# Patient Record
Sex: Female | Born: 1980 | Race: Black or African American | Hispanic: No
Health system: Southern US, Community
[De-identification: ages and names within clinical notes are randomized; demographics above are authoritative.]

## PROBLEM LIST (undated history)

## (undated) DIAGNOSIS — K219 Gastro-esophageal reflux disease without esophagitis: Secondary | ICD-10-CM

## (undated) DIAGNOSIS — R7303 Prediabetes: Secondary | ICD-10-CM

## (undated) DIAGNOSIS — F0781 Postconcussional syndrome: Secondary | ICD-10-CM

## (undated) DIAGNOSIS — E041 Nontoxic single thyroid nodule: Secondary | ICD-10-CM

## (undated) DIAGNOSIS — E785 Hyperlipidemia, unspecified: Secondary | ICD-10-CM

## (undated) DIAGNOSIS — G43909 Migraine, unspecified, not intractable, without status migrainosus: Secondary | ICD-10-CM

## (undated) DIAGNOSIS — E559 Vitamin D deficiency, unspecified: Secondary | ICD-10-CM

## (undated) DIAGNOSIS — R200 Anesthesia of skin: Secondary | ICD-10-CM

## (undated) DIAGNOSIS — J309 Allergic rhinitis, unspecified: Secondary | ICD-10-CM

## (undated) DIAGNOSIS — R202 Paresthesia of skin: Secondary | ICD-10-CM

## (undated) DIAGNOSIS — Z973 Presence of spectacles and contact lenses: Secondary | ICD-10-CM

## (undated) HISTORY — PX: APPENDECTOMY: SHX54

## (undated) HISTORY — PX: KNEE RECONSTRUCTION: SHX5883

---
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
## 2001-02-26 ENCOUNTER — Emergency Department (HOSPITAL_COMMUNITY): Admission: EM | Admit: 2001-02-26 | Discharge: 2001-02-26 | Payer: Self-pay | Admitting: Emergency Medicine

## 2012-06-19 DIAGNOSIS — F419 Anxiety disorder, unspecified: Secondary | ICD-10-CM | POA: Insufficient documentation

## 2012-06-19 DIAGNOSIS — F410 Panic disorder [episodic paroxysmal anxiety] without agoraphobia: Secondary | ICD-10-CM | POA: Insufficient documentation

## 2019-10-28 ENCOUNTER — Other Ambulatory Visit: Payer: Self-pay

## 2019-10-28 ENCOUNTER — Encounter (HOSPITAL_COMMUNITY): Payer: Self-pay | Admitting: Emergency Medicine

## 2019-10-28 ENCOUNTER — Emergency Department (HOSPITAL_COMMUNITY): Payer: BC Managed Care – PPO

## 2019-10-28 ENCOUNTER — Emergency Department (HOSPITAL_COMMUNITY)
Admission: EM | Admit: 2019-10-28 | Discharge: 2019-10-28 | Disposition: A | Discharge status: Home / Self Care | Payer: BC Managed Care – PPO | Attending: Emergency Medicine | Admitting: Emergency Medicine

## 2019-10-28 DIAGNOSIS — W19XXXA Unspecified fall, initial encounter: Secondary | ICD-10-CM

## 2019-10-28 DIAGNOSIS — H538 Other visual disturbances: Secondary | ICD-10-CM | POA: Diagnosis not present

## 2019-10-28 DIAGNOSIS — S0502XA Injury of conjunctiva and corneal abrasion without foreign body, left eye, initial encounter: Secondary | ICD-10-CM | POA: Diagnosis not present

## 2019-10-28 DIAGNOSIS — Y9389 Activity, other specified: Secondary | ICD-10-CM | POA: Insufficient documentation

## 2019-10-28 DIAGNOSIS — R42 Dizziness and giddiness: Secondary | ICD-10-CM | POA: Diagnosis not present

## 2019-10-28 DIAGNOSIS — Y92488 Other paved roadways as the place of occurrence of the external cause: Secondary | ICD-10-CM | POA: Diagnosis not present

## 2019-10-28 DIAGNOSIS — S0993XA Unspecified injury of face, initial encounter: Secondary | ICD-10-CM | POA: Diagnosis present

## 2019-10-28 DIAGNOSIS — Y999 Unspecified external cause status: Secondary | ICD-10-CM | POA: Diagnosis not present

## 2019-10-28 DIAGNOSIS — S60512A Abrasion of left hand, initial encounter: Secondary | ICD-10-CM | POA: Insufficient documentation

## 2019-10-28 DIAGNOSIS — S0081XA Abrasion of other part of head, initial encounter: Secondary | ICD-10-CM

## 2019-10-28 DIAGNOSIS — Z79899 Other long term (current) drug therapy: Secondary | ICD-10-CM | POA: Insufficient documentation

## 2019-10-28 DIAGNOSIS — S80811A Abrasion, right lower leg, initial encounter: Secondary | ICD-10-CM | POA: Diagnosis not present

## 2019-10-28 MED ORDER — ACETAMINOPHEN 325 MG PO TABS
650.0000 mg | ORAL_TABLET | Freq: Four times a day (QID) | ORAL | Status: DC | PRN
  Administered 2019-10-28: 650 mg via ORAL
  Filled 2019-10-28: qty 2

## 2019-10-28 MED ORDER — FLUORESCEIN SODIUM 1 MG OP STRP
1.0000 | ORAL_STRIP | Freq: Once | OPHTHALMIC | Status: AC
  Administered 2019-10-28: 1 via OPHTHALMIC
  Filled 2019-10-28: qty 1

## 2019-10-28 MED ORDER — TETRACAINE HCL 0.5 % OP SOLN
2.0000 [drp] | Freq: Once | OPHTHALMIC | Status: AC
  Administered 2019-10-28: 2 [drp] via OPHTHALMIC
  Filled 2019-10-28: qty 4

## 2019-10-28 MED ORDER — ERYTHROMYCIN 5 MG/GM OP OINT: TOPICAL_OINTMENT | OPHTHALMIC | 0 refills | Status: DC

## 2019-10-28 MED ORDER — ERYTHROMYCIN 5 MG/GM OP OINT
1.0000 "application " | TOPICAL_OINTMENT | Freq: Once | OPHTHALMIC | Status: DC
  Filled 2019-10-28: qty 3.5
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
## 2019-10-28 NOTE — Discharge Instructions (Addendum)
Please apply Neosporin or bacitracin twice daily for the next few days answer abrasions on your face, hand, leg heel.  Please follow-up with your PCP.  Please use Tylenol or Motrin for any further pain.  Please make sure to put the erythromycin ointment on your left eye to help heal the corneal abrasion. You have a small thyroid nodule that needs to be evaluated with a ultrasound by your PCP.

## 2019-10-28 NOTE — ED Notes (Signed)
Per RN Myland I Stat Beta and chem 8 cancelled no longer needed

## 2019-10-28 NOTE — ED Notes (Signed)
Pt verbalized understanding of discharge instructions and recommended follow-up. Time was given for questions and teach back.

## 2019-10-31 ENCOUNTER — Other Ambulatory Visit: Payer: Self-pay | Admitting: Obstetrics & Gynecology

## 2019-11-05 ENCOUNTER — Other Ambulatory Visit: Payer: Self-pay | Admitting: Obstetrics & Gynecology

## 2019-11-05 ENCOUNTER — Ambulatory Visit
Admission: RE | Admit: 2019-11-05 | Discharge: 2019-11-05 | Disposition: A | Discharge status: Home / Self Care | Payer: BC Managed Care – PPO | Source: Ambulatory Visit | Attending: Obstetrics & Gynecology | Admitting: Obstetrics & Gynecology

## 2019-11-05 DIAGNOSIS — E079 Disorder of thyroid, unspecified: Secondary | ICD-10-CM

## 2019-11-09 ENCOUNTER — Other Ambulatory Visit: Payer: Self-pay | Admitting: Internal Medicine

## 2019-11-09 DIAGNOSIS — E049 Nontoxic goiter, unspecified: Secondary | ICD-10-CM

## 2019-11-09 DIAGNOSIS — E041 Nontoxic single thyroid nodule: Secondary | ICD-10-CM

## 2019-12-04 ENCOUNTER — Other Ambulatory Visit (HOSPITAL_COMMUNITY)
Admission: RE | Admit: 2019-12-04 | Discharge: 2019-12-04 | Disposition: A | Discharge status: Home / Self Care | Payer: BC Managed Care – PPO | Source: Ambulatory Visit | Attending: Internal Medicine | Admitting: Internal Medicine

## 2019-12-04 ENCOUNTER — Ambulatory Visit
Admission: RE | Admit: 2019-12-04 | Discharge: 2019-12-04 | Disposition: A | Discharge status: Home / Self Care | Payer: BC Managed Care – PPO | Source: Ambulatory Visit | Attending: Internal Medicine | Admitting: Internal Medicine

## 2019-12-04 DIAGNOSIS — E049 Nontoxic goiter, unspecified: Secondary | ICD-10-CM | POA: Diagnosis not present

## 2019-12-04 DIAGNOSIS — E041 Nontoxic single thyroid nodule: Secondary | ICD-10-CM | POA: Diagnosis present

## 2019-12-07 LAB — CYTOLOGY - NON PAP

## 2020-11-07 ENCOUNTER — Other Ambulatory Visit: Payer: Self-pay | Admitting: Internal Medicine

## 2020-11-07 DIAGNOSIS — E041 Nontoxic single thyroid nodule: Secondary | ICD-10-CM

## 2020-11-07 DIAGNOSIS — E049 Nontoxic goiter, unspecified: Secondary | ICD-10-CM

## 2020-11-14 ENCOUNTER — Ambulatory Visit
Admission: RE | Admit: 2020-11-14 | Discharge: 2020-11-14 | Disposition: A | Discharge status: Home / Self Care | Payer: BC Managed Care – PPO | Source: Ambulatory Visit | Attending: Internal Medicine | Admitting: Internal Medicine

## 2020-11-14 DIAGNOSIS — E049 Nontoxic goiter, unspecified: Secondary | ICD-10-CM

## 2020-11-14 DIAGNOSIS — E041 Nontoxic single thyroid nodule: Secondary | ICD-10-CM

## 2021-03-18 ENCOUNTER — Ambulatory Visit
Admission: RE | Admit: 2021-03-18 | Discharge: 2021-03-18 | Disposition: A | Discharge status: Home / Self Care | Payer: BC Managed Care – PPO | Source: Ambulatory Visit | Attending: Emergency Medicine | Admitting: Emergency Medicine

## 2021-03-18 ENCOUNTER — Other Ambulatory Visit: Payer: Self-pay

## 2021-03-18 ENCOUNTER — Telehealth: Payer: Self-pay

## 2021-03-18 VITALS — BP 138/83 | HR 79 | Temp 98.1°F | Resp 18

## 2021-03-18 DIAGNOSIS — S161XXA Strain of muscle, fascia and tendon at neck level, initial encounter: Secondary | ICD-10-CM | POA: Diagnosis not present

## 2021-03-18 DIAGNOSIS — M546 Pain in thoracic spine: Secondary | ICD-10-CM

## 2021-03-18 MED ORDER — TIZANIDINE HCL 2 MG PO TABS: 2.0000 mg | ORAL_TABLET | Freq: Four times a day (QID) | ORAL | 0 refills | Status: DC | PRN

## 2021-03-18 MED ORDER — HYDROXYZINE HCL 10 MG/5ML PO SYRP: 25.0000 mg | ORAL_SOLUTION | Freq: Three times a day (TID) | ORAL | 0 refills | Status: DC | PRN

## 2021-03-18 MED ORDER — IBUPROFEN 100 MG/5ML PO SUSP: 600.0000 mg | Freq: Three times a day (TID) | ORAL | 0 refills | Status: DC

## 2021-03-18 MED ORDER — NAPROXEN 125 MG/5ML PO SUSP: 500.0000 mg | Freq: Two times a day (BID) | ORAL | 0 refills | Status: DC
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
## 2021-03-18 NOTE — Discharge Instructions (Signed)
Continue Tylenol/ibuprofen or may use naproxen as alternative for headache, neck pain and back pain Supplement tizanidine which is a muscle relaxer as needed at home/bedtime, may cause drowsiness May try hydroxyzine as needed for anxiety-this may also cause drowsiness, limit use with muscle relaxer, avoid driving after use Alternate ice and heat Follow-up if not improving or worsening
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
## 2021-03-22 ENCOUNTER — Emergency Department (HOSPITAL_COMMUNITY): Payer: BC Managed Care – PPO

## 2021-03-22 ENCOUNTER — Emergency Department (HOSPITAL_COMMUNITY)
Admission: EM | Admit: 2021-03-22 | Discharge: 2021-03-22 | Disposition: A | Discharge status: Home / Self Care | Payer: BC Managed Care – PPO | Attending: Emergency Medicine | Admitting: Emergency Medicine

## 2021-03-22 ENCOUNTER — Other Ambulatory Visit: Payer: Self-pay

## 2021-03-22 ENCOUNTER — Ambulatory Visit
Admission: RE | Admit: 2021-03-22 | Discharge: 2021-03-22 | Disposition: A | Discharge status: Home / Self Care | Payer: BC Managed Care – PPO | Source: Ambulatory Visit | Attending: Dermatology | Admitting: Dermatology

## 2021-03-22 DIAGNOSIS — S060X0A Concussion without loss of consciousness, initial encounter: Secondary | ICD-10-CM | POA: Diagnosis not present

## 2021-03-22 DIAGNOSIS — H53143 Visual discomfort, bilateral: Secondary | ICD-10-CM | POA: Diagnosis not present

## 2021-03-22 DIAGNOSIS — M545 Low back pain, unspecified: Secondary | ICD-10-CM | POA: Diagnosis not present

## 2021-03-22 DIAGNOSIS — S0990XA Unspecified injury of head, initial encounter: Secondary | ICD-10-CM | POA: Diagnosis present

## 2021-03-22 DIAGNOSIS — Y92410 Unspecified street and highway as the place of occurrence of the external cause: Secondary | ICD-10-CM | POA: Insufficient documentation

## 2021-03-22 MED ORDER — DIAZEPAM 5 MG PO TABS
5.0000 mg | ORAL_TABLET | Freq: Once | ORAL | Status: AC
  Administered 2021-03-22: 5 mg via ORAL
  Filled 2021-03-22: qty 1
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
## 2021-03-23 ENCOUNTER — Telehealth: Payer: Self-pay | Admitting: Family Medicine

## 2021-03-23 NOTE — Telephone Encounter (Signed)
Patient called stating that she was seen in the ED yesterday for a concussion and was told to follow up with [place].She has had a continued headache, brain fog, trouble finding her words, sensitivity to light and back pain.  Please advise.

## 2021-03-24 NOTE — Telephone Encounter (Signed)
Called pt and scheduled her for initial concussion visit on 03/26/21 at 3:30 pm.
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
## 2021-03-26 ENCOUNTER — Other Ambulatory Visit: Payer: Self-pay

## 2021-03-26 ENCOUNTER — Ambulatory Visit: Payer: BC Managed Care – PPO | Admitting: Family Medicine

## 2021-03-26 ENCOUNTER — Encounter: Payer: Self-pay | Admitting: Family Medicine

## 2021-03-26 VITALS — BP 102/72 | HR 89 | Ht 64.0 in | Wt 186.4 lb

## 2021-03-26 DIAGNOSIS — G43909 Migraine, unspecified, not intractable, without status migrainosus: Secondary | ICD-10-CM | POA: Insufficient documentation

## 2021-03-26 DIAGNOSIS — R42 Dizziness and giddiness: Secondary | ICD-10-CM

## 2021-03-26 DIAGNOSIS — E049 Nontoxic goiter, unspecified: Secondary | ICD-10-CM | POA: Insufficient documentation

## 2021-03-26 DIAGNOSIS — S060X0A Concussion without loss of consciousness, initial encounter: Secondary | ICD-10-CM

## 2021-03-26 DIAGNOSIS — G4700 Insomnia, unspecified: Secondary | ICD-10-CM | POA: Insufficient documentation

## 2021-03-26 MED ORDER — NORTRIPTYLINE HCL 25 MG PO CAPS: 25.0000 mg | ORAL_CAPSULE | Freq: Every day | ORAL | 2 refills | Status: DC

## 2021-03-26 NOTE — Patient Instructions (Addendum)
Thank you for coming in today.  I've referred you to Physical Therapy.  Let us know if you don't hear from them in one week.  Recheck in 1-2 weeks.   Try stopping the hydroxyzine.   Try reducing the tizanidine muscle relaxer. Both are sedating and may be slowing you down.

## 2021-04-07 ENCOUNTER — Other Ambulatory Visit: Payer: Self-pay

## 2021-04-07 ENCOUNTER — Ambulatory Visit: Payer: BC Managed Care – PPO | Admitting: Family Medicine

## 2021-04-07 VITALS — BP 102/71 | HR 79 | Ht 64.0 in | Wt 183.0 lb

## 2021-04-07 DIAGNOSIS — S060X0D Concussion without loss of consciousness, subsequent encounter: Secondary | ICD-10-CM | POA: Diagnosis not present

## 2021-04-07 DIAGNOSIS — G4700 Insomnia, unspecified: Secondary | ICD-10-CM

## 2021-04-07 DIAGNOSIS — R42 Dizziness and giddiness: Secondary | ICD-10-CM

## 2021-04-07 MED ORDER — HYDROXYZINE HCL 10 MG/5ML PO SYRP: 25.0000 mg | ORAL_SOLUTION | Freq: Three times a day (TID) | ORAL | 2 refills | Status: DC | PRN
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
## 2021-04-07 NOTE — Patient Instructions (Addendum)
Thank you for coming in today.  Recheck in 2 weeks.   Restart the hydroxyzine instead of the nortrypltine at bedtime if it works better.  Let me know if you need a refill.   Keep me updated.    I am so happy you are improving.

## 2021-04-16 ENCOUNTER — Other Ambulatory Visit: Payer: Self-pay

## 2021-04-16 ENCOUNTER — Ambulatory Visit: Payer: BC Managed Care – PPO | Attending: Family Medicine | Admitting: Physical Therapy

## 2021-04-16 ENCOUNTER — Encounter: Payer: Self-pay | Admitting: Physical Therapy

## 2021-04-16 DIAGNOSIS — R2689 Other abnormalities of gait and mobility: Secondary | ICD-10-CM | POA: Insufficient documentation

## 2021-04-16 DIAGNOSIS — R42 Dizziness and giddiness: Secondary | ICD-10-CM | POA: Insufficient documentation

## 2021-04-16 DIAGNOSIS — R2681 Unsteadiness on feet: Secondary | ICD-10-CM | POA: Insufficient documentation

## 2021-04-16 NOTE — Patient Instructions (Signed)
Bending / Picking Up Objects - PROGRESS to standing    Sitting, slowly bend head down and pick up object on the floor. Return to upright position. Hold position until symptoms subside. Repeat __5__ times per session. Do _2___ sessions per day.     Gaze Stabilization: Tip Card  1.Target must remain in focus, not blurry, and appear stationary while head is in motion. 2.Perform exercises with small head movements (45 to either side of midline). 3.Increase speed of head motion so long as target is in focus. 4.If you wear eyeglasses, be sure you can see target through lens (therapist will give specific instructions for bifocal / progressive lenses). 5.These exercises may provoke dizziness or nausea. Work through these symptoms. If too dizzy, slow head movement slightly. Rest between each exercise. 6.Exercises demand concentration; avoid distractions. 7.For safety, perform standing exercises close to a counter, wall, corner, or next to someone.    Gaze Stabilization: Standing Feet Apart - plain background    Feet shoulder width apart, keeping eyes on target on wall __6__ feet away, tilt head down 15-30 and move head side to side for _60___ seconds. Repeat while moving head up and down for _60__ seconds. Do _3-5___ sessions per day.
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
## 2021-04-21 ENCOUNTER — Other Ambulatory Visit: Payer: Self-pay

## 2021-04-21 ENCOUNTER — Encounter: Payer: Self-pay | Admitting: Family Medicine

## 2021-04-21 ENCOUNTER — Ambulatory Visit: Payer: BC Managed Care – PPO | Admitting: Family Medicine

## 2021-04-21 VITALS — BP 106/72 | HR 84 | Ht 64.0 in | Wt 188.4 lb

## 2021-04-21 DIAGNOSIS — S060X0D Concussion without loss of consciousness, subsequent encounter: Secondary | ICD-10-CM

## 2021-04-21 DIAGNOSIS — F4310 Post-traumatic stress disorder, unspecified: Secondary | ICD-10-CM | POA: Diagnosis not present

## 2021-04-21 DIAGNOSIS — R413 Other amnesia: Secondary | ICD-10-CM | POA: Diagnosis not present

## 2021-04-21 HISTORY — DX: Post-traumatic stress disorder, unspecified: F43.10
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
## 2021-04-21 NOTE — Patient Instructions (Signed)
Thank you for coming in today.  I have added speech therapy to help with memory for you.   Continue balance therapy.   Continue medicine.   Consider EMDR (Eye Movement Desensitization and Reprocessing) therapy.  You can good therapists in your area that may offer it and see if you can get an appointment.   Recheck with me in 1 month.

## 2021-04-24 ENCOUNTER — Ambulatory Visit: Payer: BC Managed Care – PPO

## 2021-04-24 ENCOUNTER — Other Ambulatory Visit: Payer: Self-pay

## 2021-04-24 DIAGNOSIS — R42 Dizziness and giddiness: Secondary | ICD-10-CM

## 2021-04-24 DIAGNOSIS — R2681 Unsteadiness on feet: Secondary | ICD-10-CM

## 2021-04-24 DIAGNOSIS — R2689 Other abnormalities of gait and mobility: Secondary | ICD-10-CM
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
## 2021-05-08 ENCOUNTER — Ambulatory Visit: Payer: BC Managed Care – PPO

## 2021-05-08 ENCOUNTER — Other Ambulatory Visit: Payer: Self-pay

## 2021-05-08 ENCOUNTER — Ambulatory Visit: Payer: BC Managed Care – PPO | Attending: Family Medicine

## 2021-05-08 DIAGNOSIS — R41841 Cognitive communication deficit: Secondary | ICD-10-CM | POA: Insufficient documentation

## 2021-05-08 DIAGNOSIS — R2689 Other abnormalities of gait and mobility: Secondary | ICD-10-CM | POA: Diagnosis present

## 2021-05-08 DIAGNOSIS — R2681 Unsteadiness on feet: Secondary | ICD-10-CM | POA: Insufficient documentation

## 2021-05-08 DIAGNOSIS — R4701 Aphasia: Secondary | ICD-10-CM | POA: Diagnosis present

## 2021-05-08 DIAGNOSIS — R42 Dizziness and giddiness: Secondary | ICD-10-CM | POA: Diagnosis present
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
## 2021-05-08 NOTE — Patient Instructions (Signed)
Access Code: [email or web address] URL: [email or web address] Date: 05/08/2021 Prepared by: Baldomero Lamy  Exercises Standing Balance with Eyes Closed on Foam - 1 x daily - 5 x weekly - 1 sets - 3 reps - 20-30 seconds hold Romberg Stance on Foam Pad with Head Rotation - 1 x daily - 5 x weekly - 2 sets - 10 reps Romberg Stance with Head Nods on Foam Pad - 1 x daily - 5 x weekly - 2 sets - 10 reps Tandem Stance - 1 x daily - 5 x weekly - 1 sets - 3 reps - 15-20 seconds hold

## 2021-05-08 NOTE — Patient Instructions (Signed)
When you have trouble saying the word you want to say:  1)  Describe it! Describe the size, color, shape, function, composition (what it's made of), and/or location to be able to have the word come sooner, or to have your listener help you out  2) "talk around the word" (say it a totally different way) -get your point out using different words than the one/ones you can't think of  3) Use a synonym - think of another word that means the exact same thing  4) DRAW! You can draw some things you want to say in order to give your listener a hint about what you're talking about  5)  Gesture- make motions to help your listener understand what you are trying to communicate  6) Write down the word, if you can - or the first letter or letters, to help you say the word or to give your listener a hint about what you're trying to say     Memory Compensation Strategies  1. Use "WARM" strategy. W= write it down A=  associate it R=  repeat it M=  make a mental picture  2. You can keep a Memory Notebook. Use a 3-ring notebook with sections for the following:  calendar, important names and phone numbers, medications, doctors' names/phone numbers, "to do list"/reminders, and a section to journal what you did each day  3. Use a calendar to write appointments down.  4. Write yourself a schedule for the day.  This can be placed on the calendar or in a separate section of the Memory Notebook.  Keeping a regular schedule can help memory.  5. Use medication organizer with sections for each day or morning/evening pills  You may need help loading it  6. Keep a basket, or pegboard by the door.   Place items that you need to take out with you in the basket or on the pegboard.  You may also want to include a message board for reminders.  7. Use sticky notes. Place sticky notes with reminders in a place where the task is performed.  For example:  "turn off the stove" placed by the stove, "lock the door"  placed on the door at eye level, "take your medications" on the bathroom mirror or by the place where you normally take your medications  8. Use alarms/timers.  Use while cooking to remind yourself to check on food or as a reminder to take your medicine, or as a reminder to make a call, or as a reminder to perform another task, etc.  9. Use a voice recorder app or small tape recorder to record important information and notes for yourself. Go back at the end of the day and listen to these.  
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
## 2021-05-11 ENCOUNTER — Other Ambulatory Visit: Payer: Self-pay

## 2021-05-11 ENCOUNTER — Ambulatory Visit: Payer: BC Managed Care – PPO

## 2021-05-11 DIAGNOSIS — R4701 Aphasia: Secondary | ICD-10-CM

## 2021-05-11 DIAGNOSIS — R42 Dizziness and giddiness: Secondary | ICD-10-CM | POA: Diagnosis not present

## 2021-05-11 DIAGNOSIS — R41841 Cognitive communication deficit: Secondary | ICD-10-CM
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
## 2021-05-11 NOTE — Patient Instructions (Signed)
  Constant Therapy- 14 day free trial  -

## 2021-05-15 ENCOUNTER — Ambulatory Visit: Payer: BC Managed Care – PPO

## 2021-05-15 ENCOUNTER — Other Ambulatory Visit: Payer: Self-pay

## 2021-05-15 DIAGNOSIS — R42 Dizziness and giddiness: Secondary | ICD-10-CM | POA: Diagnosis not present

## 2021-05-15 DIAGNOSIS — R4701 Aphasia: Secondary | ICD-10-CM

## 2021-05-15 DIAGNOSIS — R41841 Cognitive communication deficit: Secondary | ICD-10-CM
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
## 2021-05-18 ENCOUNTER — Other Ambulatory Visit: Payer: Self-pay

## 2021-05-18 ENCOUNTER — Ambulatory Visit: Payer: BC Managed Care – PPO

## 2021-05-18 DIAGNOSIS — R42 Dizziness and giddiness: Secondary | ICD-10-CM | POA: Diagnosis not present

## 2021-05-18 DIAGNOSIS — R4701 Aphasia: Secondary | ICD-10-CM

## 2021-05-18 DIAGNOSIS — R41841 Cognitive communication deficit: Secondary | ICD-10-CM
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
## 2021-05-20 ENCOUNTER — Ambulatory Visit: Payer: BC Managed Care – PPO | Admitting: Family Medicine

## 2021-05-20 ENCOUNTER — Other Ambulatory Visit: Payer: Self-pay

## 2021-05-20 VITALS — BP 108/76 | HR 86 | Ht 64.0 in | Wt 187.0 lb

## 2021-05-20 DIAGNOSIS — S060X0D Concussion without loss of consciousness, subsequent encounter: Secondary | ICD-10-CM | POA: Diagnosis not present

## 2021-05-20 DIAGNOSIS — F0781 Postconcussional syndrome: Secondary | ICD-10-CM | POA: Diagnosis not present

## 2021-05-20 MED ORDER — AMPHETAMINE-DEXTROAMPHET ER 10 MG PO CP24: 10.0000 mg | ORAL_CAPSULE | Freq: Every day | ORAL | 0 refills | Status: DC

## 2021-05-20 NOTE — Patient Instructions (Signed)
Thank you for coming in today.  Lets start the adderall for concentration and memory.   Continue speech therapy.   Recheck with me in about 1 month.   Let me know if this is not working. I have some other ideas.   Amphetamine; Dextroamphetamine extended-release capsules What is this medicine? AMPHETAMINE; DEXTROAMPHETAMINE (am FET a meen; dex troe am FET a meen) is used to treat attention-deficit hyperactivity disorder (ADHD). Federal law prohibits giving this medicine to any person other than the person for whom it was prescribed. Do not share this medicine with anyone else. This medicine may be used for other purposes; ask your health care provider or pharmacist if you have questions. COMMON BRAND NAME(S): Adderall XR, Mydayis What should I tell my health care provider before I take this medicine? They need to know if you have any of these conditions:  anxiety or panic attacks  circulation problems in fingers and toes  glaucoma  hardening or blockages of the arteries or heart blood vessels  heart disease or a heart defect  high blood pressure  history of a drug or alcohol abuse problem  history of stroke  kidney disease  liver disease  mental illness  seizures  suicidal thoughts, plans, or attempt; a previous suicide attempt by you or a family member  thyroid disease  Tourette's syndrome  an unusual or allergic reaction to dextroamphetamine, other amphetamines, other medicines, foods, dyes, or preservatives  pregnant or trying to get pregnant  breast-feeding How should I use this medicine? Take this medicine by mouth with a glass of water. Follow the directions on the prescription label. This medicine is taken just one time per day, usually in the morning after waking up. Take with or without food. Do not chew or crush this medicine. You may open the capsules and sprinkle the medicine on a spoonful of applesauce. If sprinkled on applesauce, take the dose  immediately and do not crush or chew. Do not take your medicine more often than directed. A special MedGuide will be given to you by the pharmacist with each prescription and refill. Be sure to read this information carefully each time. Talk to your pediatrician regarding the use of this medicine in children. While this drug may be prescribed for children as young as 6 years for selected conditions, precautions do apply. Some extended-release capsules are recommended for use only in children 26 years of age and older. Overdosage: If you think you have taken too much of this medicine contact a poison control center or emergency room at once. NOTE: This medicine is only for you. Do not share this medicine with others. What if I miss a dose? If you miss a dose, take it as soon as you can in the morning, but do not take it later in the day because it can cause trouble sleeping. If it is almost time for your next dose, take only that dose. Do not take double or extra doses. What may interact with this medicine? Do not take this medicine with any of the following medications:  MAOIs like Carbex, Eldepryl, Marplan, Nardil, and Parnate  other stimulant medicines for attention disorders This medicine may also interact with the following medications:  acetazolamide  alcohol  ammonium chloride  antacids  ascorbic acid  atomoxetine  caffeine  certain medicines for blood pressure  certain medicines for depression, anxiety, or psychotic disturbances  certain medicines for seizures like carbamazepine, phenobarbital, phenytoin  certain medicines for stomach problems like cimetidine, ranitidine,  famotidine, esomeprazole, omeprazole, lansoprazole, pantoprazole  lithium  medicines for colds and breathing difficulties  medicines for diabetes  medicines or dietary supplements for weight loss or to stay awake  methenamine  narcotic medicines for pain  quinidine  ritonavir  sodium  bicarbonate  St. John's wort This list may not describe all possible interactions. Give your health care provider a list of all the medicines, herbs, non-prescription drugs, or dietary supplements you use. Also tell them if you smoke, drink alcohol, or use illegal drugs. Some items may interact with your medicine. What should I watch for while using this medicine? Visit your doctor or health care professional for regular checks on your progress. This prescription requires that you follow special procedures with your doctor and pharmacy. You will need to have a new written prescription from your doctor every time you need a refill. This medicine may affect your concentration, or hide signs of tiredness. Until you know how this medicine affects you, do not drive, ride a bicycle, use machinery, or do anything that needs mental alertness. Alcohol should be avoided with some brands of this medicine. Talk to your doctor or health care professional if you have questions. Tell your doctor or health care professional if this medicine loses its effects, or if you feel you need to take more than the prescribed amount. Do not change the dosage without talking to your doctor or health care professional. Decreased appetite is a common side effect when starting this medicine. Eating small, frequent meals or snacks can help. Talk to your doctor if you continue to have poor eating habits. Height and weight growth of a child taking this medicine will be monitored closely. Do not take this medicine close to bedtime. It may prevent you from sleeping. If you are going to need surgery, an MRI, a CT scan, or other procedure, tell your doctor that you are taking this medicine. You may need to stop taking this medicine before the procedure. Tell your doctor or healthcare professional right away if you notice unexplained wounds on your fingers and toes while taking this medicine. You should also tell your healthcare provider if  you experience numbness or pain, changes in the skin color, or sensitivity to temperature in your fingers or toes. What side effects may I notice from receiving this medicine? Side effects that you should report to your doctor or health care professional as soon as possible:  allergic reactions like skin rash, itching or hives, swelling of the face, lips, or tongue  anxious  breathing problems  changes in emotions or moods  changes in vision  chest pain or chest tightness  fast, irregular heartbeat  fingers or toes feel numb, cool, painful  hallucination, loss of contact with reality  high blood pressure  males: prolonged or painful erection  seizures  signs and symptoms of serotonin syndrome like confusion, increased sweating, fever, tremor, stiff muscles, diarrhea  signs and symptoms of a stroke like changes in vision; confusion; trouble speaking or understanding; severe headaches; sudden numbness or weakness of the face, arm or leg; trouble walking; dizziness; loss of balance or coordination  suicidal thoughts or other mood changes  uncontrollable head, mouth, neck, arm, or leg movements Side effects that usually do not require medical attention (report to your doctor or health care professional if they continue or are bothersome):  dry mouth  headache  irritability  loss of appetite  nausea  trouble sleeping  weight loss This list may not describe all possible  side effects. Call your doctor for medical advice about side effects. You may report side effects to FDA at 1-800-FDA-1088. Where should I keep my medicine? Keep out of the reach of children. This medicine can be abused. Keep your medicine in a safe place to protect it from theft. Do not share this medicine with anyone. Selling or giving away this medicine is dangerous and against the law. Store at room temperature between 15 and 30 degrees C (59 and 86 degrees F). Keep container tightly closed. Protect  from light. Throw away any unused medicine after the expiration date. NOTE: This sheet is a summary. It may not cover all possible information. If you have questions about this medicine, talk to your doctor, pharmacist, or health care provider.  2021 Elsevier/Gold Standard (2017-02-06 13:37:27)

## 2021-05-21 ENCOUNTER — Other Ambulatory Visit: Payer: Self-pay

## 2021-05-21 ENCOUNTER — Ambulatory Visit: Payer: BC Managed Care – PPO

## 2021-05-21 DIAGNOSIS — R41841 Cognitive communication deficit: Secondary | ICD-10-CM

## 2021-05-21 DIAGNOSIS — R4701 Aphasia: Secondary | ICD-10-CM

## 2021-05-21 DIAGNOSIS — R42 Dizziness and giddiness: Secondary | ICD-10-CM | POA: Diagnosis not present
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
## 2021-05-21 NOTE — Patient Instructions (Signed)
  Try listening to [facility] for ~10-15 minutes. See if you can multi-task for listening and writing down information.  Things to be aware of:  Do I need to pause to write down information? Do I need to rewind because I didn't understand or remember? Did I understand all that was said?  Remember to bring your HWK from last time (sitting on your desk at work)

## 2021-05-22 ENCOUNTER — Ambulatory Visit: Payer: BC Managed Care – PPO

## 2021-05-22 DIAGNOSIS — R2689 Other abnormalities of gait and mobility: Secondary | ICD-10-CM

## 2021-05-22 DIAGNOSIS — R42 Dizziness and giddiness: Secondary | ICD-10-CM | POA: Diagnosis not present

## 2021-05-22 DIAGNOSIS — R2681 Unsteadiness on feet: Secondary | ICD-10-CM
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
## 2021-05-27 ENCOUNTER — Ambulatory Visit: Payer: BC Managed Care – PPO

## 2021-05-27 ENCOUNTER — Encounter: Payer: Self-pay | Admitting: Physical Therapy

## 2021-05-27 ENCOUNTER — Ambulatory Visit: Payer: BC Managed Care – PPO | Attending: Family Medicine | Admitting: Physical Therapy

## 2021-05-27 ENCOUNTER — Other Ambulatory Visit: Payer: Self-pay

## 2021-05-27 DIAGNOSIS — R41841 Cognitive communication deficit: Secondary | ICD-10-CM | POA: Diagnosis present

## 2021-05-27 DIAGNOSIS — R4701 Aphasia: Secondary | ICD-10-CM | POA: Insufficient documentation

## 2021-05-27 DIAGNOSIS — R2689 Other abnormalities of gait and mobility: Secondary | ICD-10-CM | POA: Insufficient documentation

## 2021-05-27 DIAGNOSIS — R2681 Unsteadiness on feet: Secondary | ICD-10-CM | POA: Insufficient documentation

## 2021-05-27 DIAGNOSIS — R42 Dizziness and giddiness: Secondary | ICD-10-CM | POA: Diagnosis present
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
## 2021-05-27 NOTE — Patient Instructions (Addendum)
Strategies for Improving Your Attention and Memory  Use good eye-contact  Give the speaker your undivided attention  Look directly at the speaker  Complete one task at a time  Avoid multitasking  Complete one task before starting a new one  Write a note to yourself if you think of something else that needs to be done  Let others know when you need quiet time and can't be interrupted  Don't answer the phone, texts, or emails while you are working on another task  Put aside distracting thoughts  If you find your mind wandering, refocus your attention on the speaker  Avoid off-topic comments or responses that may divert your attention   If something important comes to mind, let the speaker know and pause to write yourself a note: "Do you mind holding on a minute, I have to write something down."  Put thoughts on hold and focus on salient information  Limit distractions in your environment  Think about the environment around you  Limit background noise by turning off the TV or music, putting your phone away  Close the door and work in quiet  Use active listening  Actively participate in the conversation to stay focused  Paraphrase what you have heard to include the most important details  Adding some associations may help you remember  Ask questions to clarify certain points  Summarize the speaker's comments periodically  Avoid nodding your head and using "mhm" responses as these are more passive and don't help your attention  Alert the other person/people  It may be helpful to alert your listener to the fact that you may need reminders to keep on track  Tell the speaker in advance that you may need to stop them and have them repeat salient information  If you lose focus, interject and let the person know, "I'm sorry, I lost you, can you tell me again?"  Write down information  Write down pertinent information as it comes up, such as telephone numbers, names  of people, addresses, details from appointments and conversations, etc.    Tips to help facilitate better attention, concentration, focus  Do harder, longer tasks when you are most alert/awake  Break down larger tasks into small parts  Limit distractions of TV, radio, conversation, e mails/texts, appliance noise, etc - if a job is important, do it in a quiet room  Be aware of how you are functioning in high stimulation environments such as large stores, parties, restaurants - any place with lots of lights, noise, signs etc  Group conversations may be more difficult to process than one on one conversations  Give yourself extra time to process conversation, reading materials, directions or information from your healthcare providers  Organization is key - clutters of laundry, mail, paperwork, dirty dishes - all make it more difficult to concentrate  Before you start a task, have all the needed supplies, directions, recipes ready and organized. This way you don't have to go looking for something in the middle of a task and become distracted.   Be aware of fatigue - take rests or breaks when needed to re-group and re-focus

## 2021-05-29 ENCOUNTER — Ambulatory Visit: Payer: BC Managed Care – PPO | Admitting: Physical Therapy
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
## 2021-06-02 ENCOUNTER — Ambulatory Visit: Payer: BC Managed Care – PPO | Admitting: Physical Therapy

## 2021-06-02 ENCOUNTER — Other Ambulatory Visit: Payer: Self-pay

## 2021-06-02 ENCOUNTER — Ambulatory Visit: Payer: BC Managed Care – PPO

## 2021-06-02 DIAGNOSIS — R41841 Cognitive communication deficit: Secondary | ICD-10-CM

## 2021-06-02 DIAGNOSIS — R42 Dizziness and giddiness: Secondary | ICD-10-CM | POA: Diagnosis not present

## 2021-06-02 DIAGNOSIS — R4701 Aphasia: Secondary | ICD-10-CM
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
## 2021-06-03 ENCOUNTER — Ambulatory Visit: Payer: BC Managed Care – PPO

## 2021-06-03 DIAGNOSIS — R2681 Unsteadiness on feet: Secondary | ICD-10-CM

## 2021-06-03 DIAGNOSIS — R41841 Cognitive communication deficit: Secondary | ICD-10-CM

## 2021-06-03 DIAGNOSIS — R4701 Aphasia: Secondary | ICD-10-CM

## 2021-06-03 DIAGNOSIS — R42 Dizziness and giddiness: Secondary | ICD-10-CM

## 2021-06-03 DIAGNOSIS — R2689 Other abnormalities of gait and mobility: Secondary | ICD-10-CM
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
## 2021-06-03 NOTE — Patient Instructions (Addendum)
  Consider internal/external distractions when you're reading, watching television, etc. See if you can block out distracting thoughts to focus on what you are doing and/or reduce distractions within your environment (close door, turn down noise, etc) to aid focus.  Keep up the great work!
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
## 2021-06-05 ENCOUNTER — Encounter: Payer: Self-pay | Admitting: Family Medicine

## 2021-06-09 ENCOUNTER — Ambulatory Visit: Payer: BC Managed Care – PPO

## 2021-06-09 NOTE — Telephone Encounter (Signed)
I assume you mean the Adderall XR? My advice is to use goodRx and bypass your insurance and buy it yourself.  It is not very expensive.

## 2021-06-12 ENCOUNTER — Ambulatory Visit: Payer: BC Managed Care – PPO | Admitting: Physical Therapy

## 2021-06-15 ENCOUNTER — Ambulatory Visit: Payer: BC Managed Care – PPO

## 2021-06-15 ENCOUNTER — Other Ambulatory Visit: Payer: Self-pay

## 2021-06-15 DIAGNOSIS — R42 Dizziness and giddiness: Secondary | ICD-10-CM

## 2021-06-15 DIAGNOSIS — R4701 Aphasia: Secondary | ICD-10-CM

## 2021-06-15 DIAGNOSIS — R41841 Cognitive communication deficit: Secondary | ICD-10-CM

## 2021-06-15 DIAGNOSIS — R2681 Unsteadiness on feet: Secondary | ICD-10-CM

## 2021-06-15 NOTE — Addendum Note (Signed)
Addended by: Baldomero Lamy B on: 06/15/2021 02:56 PM   Modules accepted: Orders
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
## 2021-06-18 ENCOUNTER — Other Ambulatory Visit: Payer: Self-pay

## 2021-06-18 ENCOUNTER — Ambulatory Visit: Payer: BC Managed Care – PPO

## 2021-06-18 DIAGNOSIS — R42 Dizziness and giddiness: Secondary | ICD-10-CM | POA: Diagnosis not present

## 2021-06-18 DIAGNOSIS — R41841 Cognitive communication deficit: Secondary | ICD-10-CM

## 2021-06-18 DIAGNOSIS — R4701 Aphasia: Secondary | ICD-10-CM
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
## 2021-06-19 ENCOUNTER — Ambulatory Visit: Payer: Self-pay

## 2021-06-19 ENCOUNTER — Ambulatory Visit
Admission: EM | Admit: 2021-06-19 | Discharge: 2021-06-19 | Disposition: A | Discharge status: Home / Self Care | Payer: BC Managed Care – PPO | Attending: Emergency Medicine | Admitting: Emergency Medicine

## 2021-06-19 ENCOUNTER — Encounter: Payer: Self-pay | Admitting: Emergency Medicine

## 2021-06-19 DIAGNOSIS — J309 Allergic rhinitis, unspecified: Secondary | ICD-10-CM | POA: Diagnosis not present

## 2021-06-19 DIAGNOSIS — J029 Acute pharyngitis, unspecified: Secondary | ICD-10-CM

## 2021-06-19 DIAGNOSIS — J069 Acute upper respiratory infection, unspecified: Secondary | ICD-10-CM | POA: Diagnosis not present

## 2021-06-19 LAB — POCT RAPID STREP A (OFFICE): Rapid Strep A Screen: NEGATIVE

## 2021-06-19 MED ORDER — ALBUTEROL SULFATE HFA 108 (90 BASE) MCG/ACT IN AERS: 1.0000 | INHALATION_SPRAY | Freq: Four times a day (QID) | RESPIRATORY_TRACT | 0 refills | Status: DC | PRN

## 2021-06-19 MED ORDER — DEXAMETHASONE SODIUM PHOSPHATE 10 MG/ML IJ SOLN
10.0000 mg | Freq: Once | INTRAMUSCULAR | Status: AC
  Administered 2021-06-19: 10 mg via INTRAMUSCULAR

## 2021-06-19 NOTE — Discharge Instructions (Addendum)
Your rapid strep test is negative.  A throat culture is pending; we will call you if it is positive requiring treatment.    Follow up with your primary care provider if your symptoms are not improving.    
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
## 2021-06-19 NOTE — ED Triage Notes (Signed)
Pt presents today with c/o of nasal congestion and intermittent sore throat x 2 days. Denies fever.

## 2021-06-22 ENCOUNTER — Ambulatory Visit: Payer: BC Managed Care – PPO

## 2021-06-22 ENCOUNTER — Other Ambulatory Visit: Payer: Self-pay

## 2021-06-22 DIAGNOSIS — R41841 Cognitive communication deficit: Secondary | ICD-10-CM

## 2021-06-22 DIAGNOSIS — R4701 Aphasia: Secondary | ICD-10-CM

## 2021-06-22 DIAGNOSIS — R42 Dizziness and giddiness: Secondary | ICD-10-CM | POA: Diagnosis not present

## 2021-06-22 LAB — CULTURE, GROUP A STREP (THRC)
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
## 2021-06-25 ENCOUNTER — Ambulatory Visit: Payer: BC Managed Care – PPO | Admitting: Family Medicine

## 2021-06-25 ENCOUNTER — Ambulatory Visit: Payer: BC Managed Care – PPO

## 2021-06-25 ENCOUNTER — Other Ambulatory Visit: Payer: Self-pay

## 2021-06-25 ENCOUNTER — Encounter: Payer: Self-pay | Admitting: Family Medicine

## 2021-06-25 VITALS — BP 102/78 | HR 80 | Ht 64.0 in | Wt 183.4 lb

## 2021-06-25 DIAGNOSIS — R42 Dizziness and giddiness: Secondary | ICD-10-CM | POA: Diagnosis not present

## 2021-06-25 DIAGNOSIS — F0781 Postconcussional syndrome: Secondary | ICD-10-CM

## 2021-06-25 DIAGNOSIS — R4701 Aphasia: Secondary | ICD-10-CM

## 2021-06-25 DIAGNOSIS — R41841 Cognitive communication deficit: Secondary | ICD-10-CM

## 2021-06-25 MED ORDER — AMPHETAMINE-DEXTROAMPHET ER 10 MG PO CP24: 10.0000 mg | ORAL_CAPSULE | Freq: Every day | ORAL | 0 refills | Status: DC

## 2021-06-25 NOTE — Patient Instructions (Signed)
Thank you for coming in today.   Start Adderall XR 10mg  daily.  Use goodRx.  You can download the app on the phone.   Let me know if you have a problem with the medicine  Not working well or anxiety or not sleeping or jittery or too locked in.   Recheck in 1 month to rethink the medicine and hopefully wrap up the concussion visits.
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
## 2021-07-16 ENCOUNTER — Ambulatory Visit: Payer: BC Managed Care – PPO | Admitting: Family Medicine

## 2021-07-24 ENCOUNTER — Telehealth: Payer: Self-pay | Admitting: Family Medicine

## 2021-07-24 NOTE — Telephone Encounter (Signed)
Patient called asking for a refill on amphetamine-dextroamphetamine (ADDERALL XR) 10 MG 24 hr capsule

## 2021-07-27 MED ORDER — AMPHETAMINE-DEXTROAMPHET ER 10 MG PO CP24: 10.0000 mg | ORAL_CAPSULE | Freq: Every day | ORAL | 0 refills | Status: DC

## 2021-07-27 NOTE — Telephone Encounter (Signed)
Done
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
## 2021-09-25 NOTE — Therapy (Signed)
[facility] [address] [place], [place], [address] Phone: [phone]   Fax:  [phone]  Patient Details  Name: Donna Dorsey MRN: [id number] Date of Birth: August 14, 1981 Referring Provider:  Rodolph Bong, MD  Encounter Date: 09/25/2021  SPEECH THERAPY DISCHARGE SUMMARY  Visits from Start of Care: 12  Current functional level related to goals / functional outcomes: Pt did not return for ST intervention. Pt exhibited good progress targeting aphasia and cognition with use of SLP recommendations and training. Pt contacted SLP to report she was pleased with her progress and did not need to return for more ST sessions.   Remaining deficits: Unknown at this time    Education / Equipment: Memory and attention compensations, anomia compensations, functional application   Patient agrees to discharge. Patient goals were partially met. Patient is being discharged due to being pleased with the current functional level.Marland Kitchen      SLP Short Term Goals - 06/15/21 1306                SLP SHORT TERM GOAL #1    Title Pt will verbalize and demonstrate 3 attention/memory strategies to aid functioning at work and home with min A over 2 sessions     Baseline 05-11-21, 06-02-21     Period --   or 9 visits for all STGs    Status Achieved          SLP SHORT TERM GOAL #2    Title Pt will verbalize and carryover multi-step directions to successfully complete mod complex tasks with rare min A over 2 sessions     Baseline 06-02-21, 06-03-21     Status Achieved          SLP SHORT TERM GOAL #3    Title Pt will comprehend mod complex auditory and written information to accurately complete structured tasks with rare min A over 3 sessions     Baseline 05-15-21, 06-02-21, 06-15-21     Time --     Period --     Status Achieved          SLP SHORT TERM GOAL #4    Title Pt will demonstrate use of word finding strategies in 15 min mod complex  conversation with min A over 2 sessions     Baseline 06-02-21, 06-15-21     Time --     Period --     Status Achieved                     SLP Long Term Goals - 06/25/21 1121                SLP LONG TERM GOAL #1    Title Pt will demonstrate ability to comprehend and complete complex cognitive communication tasks with rare min A over 2 sessions     Baseline 06-25-21     Time 2     Period Weeks   or 17 visits for all LTGs    Status On-going          SLP LONG TERM GOAL #2    Title Pt will demonstrate word finding strategies in 30 min mod complex to complex conversation with rare min A over 2 sessions     Baseline 06-25-21     Time 2     Period Weeks     Status On-going          SLP LONG TERM GOAL #3  Title Pt will report reduced frustration and improved cognitive communication effectiveness via QOL scale by 3 points by last ST session     Time 2     Period Weeks     Status On-going          SLP LONG TERM GOAL #4    Title 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

## 2021-10-05 ENCOUNTER — Other Ambulatory Visit: Payer: Self-pay | Admitting: Internal Medicine

## 2021-10-05 DIAGNOSIS — E049 Nontoxic goiter, unspecified: Secondary | ICD-10-CM

## 2021-10-05 DIAGNOSIS — E041 Nontoxic single thyroid nodule: Secondary | ICD-10-CM

## 2021-10-23 ENCOUNTER — Ambulatory Visit
Admission: RE | Admit: 2021-10-23 | Discharge: 2021-10-23 | Disposition: A | Discharge status: Home / Self Care | Payer: BC Managed Care – PPO | Source: Ambulatory Visit | Attending: Internal Medicine | Admitting: Internal Medicine

## 2021-10-23 DIAGNOSIS — E041 Nontoxic single thyroid nodule: Secondary | ICD-10-CM

## 2021-10-23 DIAGNOSIS — E049 Nontoxic goiter, unspecified: Secondary | ICD-10-CM

## 2021-11-13 ENCOUNTER — Other Ambulatory Visit: Payer: Self-pay | Admitting: Internal Medicine

## 2021-11-13 DIAGNOSIS — E041 Nontoxic single thyroid nodule: Secondary | ICD-10-CM

## 2021-11-24 ENCOUNTER — Ambulatory Visit
Admission: RE | Admit: 2021-11-24 | Discharge: 2021-11-24 | Disposition: A | Discharge status: Home / Self Care | Payer: BC Managed Care – PPO | Source: Ambulatory Visit | Attending: Internal Medicine | Admitting: Internal Medicine

## 2021-11-24 ENCOUNTER — Other Ambulatory Visit (HOSPITAL_COMMUNITY)
Admission: RE | Admit: 2021-11-24 | Discharge: 2021-11-24 | Disposition: A | Discharge status: Home / Self Care | Payer: BC Managed Care – PPO | Source: Ambulatory Visit | Attending: Interventional Radiology | Admitting: Interventional Radiology

## 2021-11-24 DIAGNOSIS — D34 Benign neoplasm of thyroid gland: Secondary | ICD-10-CM | POA: Diagnosis not present

## 2021-11-24 DIAGNOSIS — E041 Nontoxic single thyroid nodule: Secondary | ICD-10-CM | POA: Diagnosis present

## 2021-11-25 LAB — CYTOLOGY - NON PAP

## 2022-02-16 ENCOUNTER — Other Ambulatory Visit: Payer: Self-pay | Admitting: Otolaryngology

## 2022-03-04 ENCOUNTER — Encounter (HOSPITAL_BASED_OUTPATIENT_CLINIC_OR_DEPARTMENT_OTHER): Payer: Self-pay | Admitting: Otolaryngology

## 2022-03-04 ENCOUNTER — Other Ambulatory Visit: Payer: Self-pay

## 2022-03-15 ENCOUNTER — Other Ambulatory Visit: Payer: Self-pay

## 2022-03-15 ENCOUNTER — Ambulatory Visit (HOSPITAL_BASED_OUTPATIENT_CLINIC_OR_DEPARTMENT_OTHER)
Admission: RE | Admit: 2022-03-15 | Discharge: 2022-03-16 | Disposition: A | Discharge status: Home / Self Care | Payer: BC Managed Care – PPO | Attending: Otolaryngology | Admitting: Otolaryngology

## 2022-03-15 ENCOUNTER — Encounter (HOSPITAL_BASED_OUTPATIENT_CLINIC_OR_DEPARTMENT_OTHER): Payer: Self-pay | Admitting: Otolaryngology

## 2022-03-15 ENCOUNTER — Ambulatory Visit (HOSPITAL_BASED_OUTPATIENT_CLINIC_OR_DEPARTMENT_OTHER): Payer: BC Managed Care – PPO | Admitting: Anesthesiology

## 2022-03-15 ENCOUNTER — Encounter (HOSPITAL_BASED_OUTPATIENT_CLINIC_OR_DEPARTMENT_OTHER)
Admission: RE | Disposition: A | Discharge status: Home / Self Care | Payer: Self-pay | Source: Home / Self Care | Attending: Otolaryngology

## 2022-03-15 DIAGNOSIS — F4310 Post-traumatic stress disorder, unspecified: Secondary | ICD-10-CM | POA: Insufficient documentation

## 2022-03-15 DIAGNOSIS — R1310 Dysphagia, unspecified: Secondary | ICD-10-CM | POA: Insufficient documentation

## 2022-03-15 DIAGNOSIS — E079 Disorder of thyroid, unspecified: Secondary | ICD-10-CM | POA: Diagnosis present

## 2022-03-15 DIAGNOSIS — F419 Anxiety disorder, unspecified: Secondary | ICD-10-CM | POA: Diagnosis not present

## 2022-03-15 DIAGNOSIS — D34 Benign neoplasm of thyroid gland: Secondary | ICD-10-CM | POA: Diagnosis not present

## 2022-03-15 DIAGNOSIS — E890 Postprocedural hypothyroidism: Secondary | ICD-10-CM

## 2022-03-15 HISTORY — DX: Allergic rhinitis, unspecified: J30.9

## 2022-03-15 HISTORY — DX: Postconcussional syndrome: F07.81

## 2022-03-15 HISTORY — DX: Nontoxic single thyroid nodule: E04.1

## 2022-03-15 HISTORY — PX: THYROID LOBECTOMY: SHX420

## 2022-03-15 LAB — POCT PREGNANCY, URINE: Preg Test, Ur: NEGATIVE

## 2022-03-15 SURGERY — LOBECTOMY, THYROID
Anesthesia: General | Site: Neck | Laterality: Left

## 2022-03-15 MED ORDER — ONDANSETRON HCL 4 MG/2ML IJ SOLN: 4.0000 mg | Freq: Once | INTRAMUSCULAR | Status: DC | PRN

## 2022-03-15 MED ORDER — ONDANSETRON HCL 4 MG PO TABS: 4.0000 mg | ORAL_TABLET | ORAL | Status: DC | PRN

## 2022-03-15 MED ORDER — MIDAZOLAM HCL 2 MG/2ML IJ SOLN
INTRAMUSCULAR | Status: AC
  Filled 2022-03-15: qty 2

## 2022-03-15 MED ORDER — LIDOCAINE-EPINEPHRINE 1 %-1:100000 IJ SOLN
INTRAMUSCULAR | Status: DC | PRN
  Administered 2022-03-15: 3 mL

## 2022-03-15 MED ORDER — PROPOFOL 10 MG/ML IV BOLUS
INTRAVENOUS | Status: DC | PRN
  Administered 2022-03-15: 150 mg via INTRAVENOUS
  Administered 2022-03-15: 20 mg via INTRAVENOUS

## 2022-03-15 MED ORDER — LIDOCAINE 2% (20 MG/ML) 5 ML SYRINGE
INTRAMUSCULAR | Status: AC
  Filled 2022-03-15: qty 5

## 2022-03-15 MED ORDER — MORPHINE SULFATE (PF) 4 MG/ML IV SOLN: 2.0000 mg | INTRAVENOUS | Status: DC | PRN

## 2022-03-15 MED ORDER — CEFAZOLIN SODIUM 1 G IJ SOLR
INTRAMUSCULAR | Status: AC
  Filled 2022-03-15: qty 20

## 2022-03-15 MED ORDER — ONDANSETRON HCL 4 MG/2ML IJ SOLN
4.0000 mg | INTRAMUSCULAR | Status: DC | PRN
Start: 2022-03-15 — End: 2022-03-16

## 2022-03-15 MED ORDER — PROPOFOL 10 MG/ML IV BOLUS
INTRAVENOUS | Status: AC
  Filled 2022-03-15: qty 20

## 2022-03-15 MED ORDER — AMISULPRIDE (ANTIEMETIC) 5 MG/2ML IV SOLN: 10.0000 mg | Freq: Once | INTRAVENOUS | Status: DC | PRN

## 2022-03-15 MED ORDER — ACETAMINOPHEN 500 MG PO TABS
1000.0000 mg | ORAL_TABLET | Freq: Once | ORAL | Status: AC
  Administered 2022-03-15: 1000 mg via ORAL

## 2022-03-15 MED ORDER — FENTANYL CITRATE (PF) 100 MCG/2ML IJ SOLN
INTRAMUSCULAR | Status: DC | PRN
  Administered 2022-03-15 (×2): 50 ug via INTRAVENOUS
  Administered 2022-03-15: 100 ug via INTRAVENOUS

## 2022-03-15 MED ORDER — ACETAMINOPHEN 160 MG/5ML PO SOLN: 650.0000 mg | ORAL | Status: DC | PRN

## 2022-03-15 MED ORDER — LIDOCAINE HCL (CARDIAC) PF 100 MG/5ML IV SOSY
PREFILLED_SYRINGE | INTRAVENOUS | Status: DC | PRN
  Administered 2022-03-15: 80 mg via INTRAVENOUS

## 2022-03-15 MED ORDER — ACETAMINOPHEN 325 MG RE SUPP: 650.0000 mg | RECTAL | Status: DC | PRN

## 2022-03-15 MED ORDER — LORAZEPAM 2 MG/ML IJ SOLN: 1.0000 mg | Freq: Once | INTRAMUSCULAR | Status: DC | PRN

## 2022-03-15 MED ORDER — DEXAMETHASONE SODIUM PHOSPHATE 10 MG/ML IJ SOLN
INTRAMUSCULAR | Status: AC
  Filled 2022-03-15: qty 1

## 2022-03-15 MED ORDER — OXYCODONE HCL 5 MG/5ML PO SOLN: 5.0000 mg | Freq: Once | ORAL | Status: DC | PRN

## 2022-03-15 MED ORDER — FENTANYL CITRATE (PF) 100 MCG/2ML IJ SOLN
INTRAMUSCULAR | Status: AC
  Filled 2022-03-15: qty 2

## 2022-03-15 MED ORDER — SCOPOLAMINE 1 MG/3DAYS TD PT72
1.0000 | MEDICATED_PATCH | TRANSDERMAL | Status: DC
  Administered 2022-03-15: 1.5 mg via TRANSDERMAL

## 2022-03-15 MED ORDER — FENTANYL CITRATE (PF) 100 MCG/2ML IJ SOLN
25.0000 ug | INTRAMUSCULAR | Status: DC | PRN
  Administered 2022-03-15: 50 ug via INTRAVENOUS

## 2022-03-15 MED ORDER — SCOPOLAMINE 1 MG/3DAYS TD PT72
MEDICATED_PATCH | TRANSDERMAL | Status: AC
  Filled 2022-03-15: qty 1

## 2022-03-15 MED ORDER — ONDANSETRON HCL 4 MG/2ML IJ SOLN
INTRAMUSCULAR | Status: DC | PRN
  Administered 2022-03-15: 4 mg via INTRAVENOUS

## 2022-03-15 MED ORDER — ACETAMINOPHEN 500 MG PO TABS
ORAL_TABLET | ORAL | Status: AC
  Filled 2022-03-15: qty 2

## 2022-03-15 MED ORDER — 0.9 % SODIUM CHLORIDE (POUR BTL) OPTIME
TOPICAL | Status: DC | PRN
  Administered 2022-03-15: 1000 mL

## 2022-03-15 MED ORDER — SUCCINYLCHOLINE CHLORIDE 200 MG/10ML IV SOSY
PREFILLED_SYRINGE | INTRAVENOUS | Status: DC | PRN
Start: 2022-03-15 — End: 2022-03-15
  Administered 2022-03-15: 160 mg via INTRAVENOUS

## 2022-03-15 MED ORDER — MIDAZOLAM HCL 5 MG/5ML IJ SOLN
INTRAMUSCULAR | Status: DC | PRN
  Administered 2022-03-15: 2 mg via INTRAVENOUS

## 2022-03-15 MED ORDER — OXYCODONE HCL 5 MG PO TABS: 5.0000 mg | ORAL_TABLET | Freq: Once | ORAL | Status: DC | PRN

## 2022-03-15 MED ORDER — KCL IN DEXTROSE-NACL 20-5-0.45 MEQ/L-%-% IV SOLN
INTRAVENOUS | Status: DC
  Filled 2022-03-15: qty 1000

## 2022-03-15 MED ORDER — DEXAMETHASONE SODIUM PHOSPHATE 4 MG/ML IJ SOLN
INTRAMUSCULAR | Status: DC | PRN
  Administered 2022-03-15: 10 mg via INTRAVENOUS

## 2022-03-15 MED ORDER — OXYCODONE-ACETAMINOPHEN 5-325 MG PO TABS
1.0000 | ORAL_TABLET | ORAL | Status: DC | PRN
  Administered 2022-03-15 (×2): 1 via ORAL
  Filled 2022-03-15 (×2): qty 1

## 2022-03-15 MED ORDER — FENTANYL CITRATE (PF) 100 MCG/2ML IJ SOLN
INTRAMUSCULAR | Status: AC
Start: 2022-03-15 — End: ?
  Filled 2022-03-15: qty 2

## 2022-03-15 MED ORDER — LACTATED RINGERS IV SOLN: INTRAVENOUS | Status: DC

## 2022-03-15 MED ORDER — CEFAZOLIN SODIUM-DEXTROSE 2-3 GM-%(50ML) IV SOLR
INTRAVENOUS | Status: DC | PRN
  Administered 2022-03-15: 2 g via INTRAVENOUS

## 2022-03-15 MED ORDER — ONDANSETRON HCL 4 MG/2ML IJ SOLN
INTRAMUSCULAR | Status: AC
  Filled 2022-03-15: qty 2

## 2022-03-15 SURGICAL SUPPLY — 57 items
ATTRACTOMAT 16X20 MAGNETIC DRP (DRAPES) IMPLANT
BLADE CLIPPER SURG (BLADE) IMPLANT
BLADE SURG 10 STRL SS (BLADE) IMPLANT
BLADE SURG 15 STRL LF DISP TIS (BLADE) ×1 IMPLANT
BLADE SURG 15 STRL SS (BLADE) ×2
CANISTER SUCT 1200ML W/VALVE (MISCELLANEOUS) ×2 IMPLANT
CLIP TI WIDE RED SMALL 6 (CLIP) IMPLANT
CORD BIPOLAR FORCEPS 12FT (ELECTRODE) ×2 IMPLANT
COVER BACK TABLE 60X90IN (DRAPES) ×2 IMPLANT
COVER MAYO STAND STRL (DRAPES) ×2 IMPLANT
DERMABOND ADVANCED (GAUZE/BANDAGES/DRESSINGS) ×1
DERMABOND ADVANCED .7 DNX12 (GAUZE/BANDAGES/DRESSINGS) ×1 IMPLANT
DRAIN CHANNEL 10F 3/8 F FF (DRAIN) ×1 IMPLANT
DRAPE U-SHAPE 76X120 STRL (DRAPES) ×2 IMPLANT
ELECT COATED BLADE 2.86 ST (ELECTRODE) ×2 IMPLANT
ELECT REM PT RETURN 9FT ADLT (ELECTROSURGICAL) ×2
ELECTRODE REM PT RTRN 9FT ADLT (ELECTROSURGICAL) ×1 IMPLANT
EVACUATOR SILICONE 100CC (DRAIN) ×1 IMPLANT
FORCEPS BIPOLAR SPETZLER 8 1.0 (NEUROSURGERY SUPPLIES) ×2 IMPLANT
GAUZE 4X4 16PLY ~~LOC~~+RFID DBL (SPONGE) ×2 IMPLANT
GAUZE SPONGE 4X4 12PLY STRL LF (GAUZE/BANDAGES/DRESSINGS) IMPLANT
GLOVE SURG ENC MOIS LTX SZ6.5 (GLOVE) IMPLANT
GLOVE SURG ENC MOIS LTX SZ7.5 (GLOVE) ×2 IMPLANT
GOWN STRL REUS W/ TWL LRG LVL3 (GOWN DISPOSABLE) ×2 IMPLANT
GOWN STRL REUS W/TWL LRG LVL3 (GOWN DISPOSABLE) ×4
HEMOSTAT SURGICEL 2X14 (HEMOSTASIS) IMPLANT
NDL HYPO 25X1 1.5 SAFETY (NEEDLE) ×1 IMPLANT
NEEDLE HYPO 25X1 1.5 SAFETY (NEEDLE) ×2 IMPLANT
NS IRRIG 1000ML POUR BTL (IV SOLUTION) ×2 IMPLANT
PACK BASIN DAY SURGERY FS (CUSTOM PROCEDURE TRAY) ×2 IMPLANT
PENCIL SMOKE EVACUATOR (MISCELLANEOUS) ×2 IMPLANT
PIN SAFETY STERILE (MISCELLANEOUS) IMPLANT
PROBE NERVBE PRASS .33 (MISCELLANEOUS) ×2 IMPLANT
SET WALTER ACTIVATION W/DRAPE (SET/KITS/TRAYS/PACK) ×1 IMPLANT
SHEARS HARMONIC 9CM CVD (BLADE) ×2 IMPLANT
SLEEVE SCD COMPRESS KNEE MED (STOCKING) ×2 IMPLANT
SPIKE FLUID TRANSFER (MISCELLANEOUS) ×1 IMPLANT
SPONGE INTESTINAL PEANUT (DISPOSABLE) ×3 IMPLANT
STAPLER VISISTAT 35W (STAPLE) IMPLANT
SUT ETHILON 3 0 PS 1 (SUTURE) ×2 IMPLANT
SUT PROLENE 5 0 P 3 (SUTURE) IMPLANT
SUT SILK 2 0 SH (SUTURE) ×2 IMPLANT
SUT SILK 2 0 TIES 17X18 (SUTURE) ×2
SUT SILK 2-0 18XBRD TIE BLK (SUTURE) IMPLANT
SUT SILK 3 0 TIES 17X18 (SUTURE)
SUT SILK 3-0 18XBRD TIE BLK (SUTURE) ×2 IMPLANT
SUT VIC AB 3-0 FS2 27 (SUTURE) ×1 IMPLANT
SUT VICRYL 4-0 PS2 18IN ABS (SUTURE) ×2 IMPLANT
SYR BULB EAR ULCER 3OZ GRN STR (SYRINGE) ×2 IMPLANT
SYR CONTROL 10ML LL (SYRINGE) ×2 IMPLANT
TOWEL GREEN STERILE FF (TOWEL DISPOSABLE) ×3 IMPLANT
TRAY DSU PREP LF (CUSTOM PROCEDURE TRAY) ×2 IMPLANT
TUBE CONNECTING 20X1/4 (TUBING) ×2 IMPLANT
TUBE ENDOTRAC NIMS EMG 6MM (MISCELLANEOUS) IMPLANT
TUBE ENDOTRAC NIMS EMG 7MM (MISCELLANEOUS) IMPLANT
TUBE ENDOTRAC NIMS EMG 8MM (MISCELLANEOUS) ×1 IMPLANT
TUBE ENDOTRAC NIMS EMG 9MM (MISCELLANEOUS) IMPLANT

## 2022-03-15 NOTE — H&P (Signed)
Cc: Left thyroid mass ? ?HPI: ?The patient is a 41 year old female who presents today complaining of a large left thyroid mass.  According to the patient, she was found to have a thyroid goiter on her CT scan 2 ? years ago.  She has a large left thyroid nodule that measures 5 cm in diameter.  She underwent a fine needle aspiration biopsy twice.  They were all negative.  According to the patient, the size of the nodule has increased.  It has resulted in intermittent dysphagia.  She denies any odynophagia or dyspnea.  She is interested in having the thyroid nodule removed.  Currently she denies any hoarseness.  She has no previous history of ENT surgery.  ? ?The patient's review of systems (constitutional, eyes, ENT, cardiovascular, respiratory, GI, musculoskeletal, skin, neurologic, psychiatric, endocrine, hematologic, allergic) is noted in the ROS questionnaire.  It is reviewed with the patient. ? ?Major events: Appendectomy, total knee replacement, C-section.  ?Ongoing medical problems: None.  ?Family health history: Diabetes, heart disease.  ?Social history: The patient is married. She denies the use of tobacco, alcohol or illegal drugs.  ? ?Exam: ?General: Communicates without difficulty, well nourished, no acute distress. Head: Normocephalic, no evidence injury, no tenderness, facial buttresses intact without stepoff. Face/sinus: No tenderness to palpation and percussion. Facial movement is normal and symmetric. Eyes: PERRL, EOMI. No scleral icterus, conjunctivae clear. Neuro: CN II exam reveals vision grossly intact.  No nystagmus at any point of gaze. Ears: Auricles well formed without lesions.  Ear canals are intact without mass or lesion.  No erythema or edema is appreciated.  The TMs are intact without fluid. Nose: External evaluation reveals normal support and skin without lesions.  Dorsum is intact.  Anterior rhinoscopy reveals pink mucosa over anterior aspect of inferior turbinates and intact septum.  No  purulence noted. Oral:  Oral cavity and oropharynx are intact, symmetric, without erythema or edema.  Mucosa is moist without lesions. Neck: Full range of motion without pain.  There is no significant lymphadenopathy.  No masses palpable.  Thyroid bed with large left thyroid nodule. Parotid glands and submandibular glands equal bilaterally without mass.  Trachea is midline. Neuro:  CN 2-12 grossly intact. Gait normal. A flexible scope was inserted into the right nasal cavity and advanced towards the nasopharynx.  Visualized mucosa over the turbinates and septum were as described above.  The nasopharynx was clear.  Oropharyngeal walls were symmetric and mobile without lesion, mass, or edema.  Hypopharynx was also without  lesion or edema.  Larynx was mobile without lesions. Supraglottic structures were free of edema, mass, and asymmetry.  True vocal folds were white, mobile, without mass or lesion.  Base of tongue was within normal limits.  ? ?Assessment  ?1.  The patient has a large 5 cm left thyroid nodule.  It is causing compressive effects with occasional dysphagia.  Her previous fine needle aspiration biopsy was benign.   ?2.  The patient has a normal laryngoscopy examination today.  Both vocal cords are intact and mobile.  ? ?Plan  ?1.  The physical exam and laryngoscopy findings are reviewed with the patient.  ?2.  The treatment options are discussed.  The options include conservative observation versus left hemithyroidectomy surgery.  The risks, benefits, alternatives and details of the procedure are extensively discussed.  Questions are invited and answered. The specific risk to the recurrent laryngeal nerve is also discussed.   ?3.  The patient would like to proceed with the procedure.  We will schedule the procedure in accordance with the patient?s schedule.  ? ?

## 2022-03-15 NOTE — Transfer of Care (Signed)
Immediate Anesthesia Transfer of Care Note ? ?Patient: Donna Dorsey ? ?Procedure(s) Performed: LEFT HEMI THYROIDECTOMY (Left: Neck) ? ?Patient Location: PACU ? ?Anesthesia Type:General ? ?Level of Consciousness: drowsy ? ?Airway & Oxygen Therapy: Patient Spontanous Breathing and Patient connected to face mask oxygen ? ?Post-op Assessment: Report given to RN and Post -op Vital signs reviewed and stable ? ?Post vital signs: Reviewed and stable ? ?Last Vitals:  ?Vitals Value Taken Time  ?BP 106/58 03/15/22 1121  ?Temp    ?Pulse 78 03/15/22 1123  ?Resp 8 03/15/22 1123  ?SpO2 96 % 03/15/22 1123  ?Vitals shown include unvalidated device data. ? ?Last Pain:  ?Vitals:  ? 03/15/22 0713  ?TempSrc: Oral  ?PainSc: 0-No pain  ?   ? ?Patients Stated Pain Goal: 4 (03/15/22 1117) ? ?Complications: No notable events documented. ?

## 2022-03-15 NOTE — Anesthesia Procedure Notes (Signed)
Procedure Name: Intubation ?Date/Time: 03/15/2022 9:10 AM ?Performed by: Ezequiel Kayser, CRNA ?Pre-anesthesia Checklist: Patient identified, Emergency Drugs available, Suction available and Patient being monitored ?Patient Re-evaluated:Patient Re-evaluated prior to induction ?Oxygen Delivery Method: Circle System Utilized ?Preoxygenation: Pre-oxygenation with 100% oxygen ?Induction Type: IV induction ?Ventilation: Mask ventilation without difficulty ?Laryngoscope Size: Glidescope and 3 ?Grade View: Grade I ?Tube type: Oral ?Tube size: 8.0 mm ?Number of attempts: 1 ?Airway Equipment and Method: Stylet and Oral airway ?Placement Confirmation: ETT inserted through vocal cords under direct vision, positive ETCO2 and breath sounds checked- equal and bilateral ?Tube secured with: Tape ?Dental Injury: Teeth and Oropharynx as per pre-operative assessment  ? ? ? ? ?
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
## 2022-03-15 NOTE — Anesthesia Postprocedure Evaluation (Signed)
Anesthesia Post Note ? ?Patient: Makeda Peeks ? ?Procedure(s) Performed: LEFT HEMI THYROIDECTOMY (Left: Neck) ? ?  ? ?Patient location during evaluation: PACU ?Anesthesia Type: General ?Level of consciousness: awake ?Pain management: pain level controlled ?Vital Signs Assessment: post-procedure vital signs reviewed and stable ?Respiratory status: spontaneous breathing and respiratory function stable ?Cardiovascular status: stable ?Postop Assessment: no apparent nausea or vomiting ?Anesthetic complications: no ? ? ?No notable events documented. ? ?Last Vitals:  ?Vitals:  ? 03/15/22 1215 03/15/22 1245  ?BP: 114/60 133/77  ?Pulse: 80 73  ?Resp: 16 18  ?Temp:  36.4 ?C  ?SpO2: 94% 96%  ?  ?Last Pain:  ?Vitals:  ? 03/15/22 1200  ?TempSrc:   ?PainSc: Asleep  ? ? ?  ?  ?  ?  ?  ?  ? ?Merlinda Frederick ? ? ? ? ?

## 2022-03-15 NOTE — Op Note (Signed)
DATE OF PROCEDURE:  03/15/2022 ? ?                            OPERATIVE REPORT ? ?SURGEON:  Leta Baptist, MD ? ?PREOPERATIVE DIAGNOSES: ?1. Left thyroid mass. ?  ?POSTOPERATIVE DIAGNOSES: ?1. Left thyroid mass. ?  ?PROCEDURE PERFORMED: Left total thyroid lobectomy. ?  ?ANESTHESIA:  General endotracheal tube anesthesia. ?  ?COMPLICATIONS:  None. ?  ?ESTIMATED BLOOD LOSS: 20 ml ?  ?INDICATION FOR PROCEDURE: Donna Dorsey a 41 y.o. female with a history of a large left thyroid nodule. The patient underwent a thyroid ultrasound which showed a 5 cm left thyroid nodule. She underwent a fine needle aspiration biopsy twice.  They were all negative.  According to the patient, the size of the nodule has increased.  It has resulted in intermittent dysphagia.  Based on the above findings, the decision was made for the patient to undergo the above stated procedure. Likelihood of success in reducing symptoms was also discussed.  The risks, benefits, alternatives, and details of the procedure were discussed with the patient.  Questions were invited and answered.  Informed consent was obtained. ?  ?DESCRIPTION:  The patient was taken to the operating room and placed supine on the operating table.  General endotracheal tube anesthesia was administered by the anesthesiologist.  A nerve monitoring endotracheal tube was used.  The nerve monitoring system was functional throughout the case. ?  ?The patient was positioned and prepped and draped in the standard fashion for thyroid surgery.  1% lidocaine with 1 100,000 epinephrine was infiltrated at the planned site of incision.  A transverse lower neck incision was made.  The incision was carried down to the level of the platysma muscles.  Superiorly based and inferiorly based subplatysmal flaps were elevated in the standard fashion.  The strap muscles were divided at midline, and retracted laterally, exposing the thyroid gland. ?  ?Careful dissection was performed to free the left thyroid  lobe from the surrounding soft tissue.  The patient was noted to have a large 5 cm left thyroid nodule.  The left recurrent laryngeal nerve was identified and preserved.  The nerve was noted to be functional throughout the case.  An inferior left parathyroid gland was also identified and preserved.  The entire left thyroid lobe was resected and sent to the pathology department for permanent histologic identification.  A #10 JP drain was placed.  The strap muscles were reapproximated with 4-0 Vicryl sutures.  The incision was closed in layers with 4-0 Vicryl and Dermabond. ?  ?The care of the patient was turned over to the anesthesiologist.  The patient was awakened from anesthesia without difficulty.  The patient was extubated and transferred to the recovery room in good condition. ?  ?OPERATIVE FINDINGS:  A large 5 cm left thyroid mass. ?  ?SPECIMEN: Left  thyroid lobe. ?  ?FOLLOWUP CARE:  The patient will be admitted for overnight observation. ? ?Donna Dorsey ?03/15/2022 11:08 AM  ?

## 2022-03-16 DIAGNOSIS — D34 Benign neoplasm of thyroid gland: Secondary | ICD-10-CM | POA: Diagnosis not present

## 2022-03-16 LAB — SURGICAL PATHOLOGY

## 2022-03-16 MED ORDER — OXYCODONE-ACETAMINOPHEN 5-325 MG PO TABS: 1.0000 | ORAL_TABLET | ORAL | 0 refills | Status: AC | PRN

## 2022-03-16 MED ORDER — AMOXICILLIN 875 MG PO TABS: 875.0000 mg | ORAL_TABLET | Freq: Two times a day (BID) | ORAL | 0 refills | Status: AC

## 2022-03-16 NOTE — Discharge Summary (Signed)
Physician Discharge Summary  ?Patient ID: ?Donna Dorsey ?MRN: [id number] ?DOB/AGE: [birth year] 41 y.o. ? ?Admit date: 03/15/2022 ?Discharge date: 03/16/2022 ? ?Admission Diagnoses: Left thyroid mass ? ?Discharge Diagnoses: Left thyroid mass ?Principal Problem: ?  S/P partial thyroidectomy ? ? ?Discharged Condition: good ? ?Hospital Course: Pt had an uneventful overnight stay. Pt tolerated po well. No bleeding. No stridor.  ? ?Consults: None ? ?Significant Diagnostic Studies: None ? ?Treatments: surgery: Left hemithyroidectomy ? ?Discharge Exam: ?Blood pressure 116/67, pulse 65, temperature 98.8 ?F (37.1 ?C), resp. rate 18, height '5\' 4"'$  (1.626 m), weight 83.4 kg, last menstrual period 03/03/2022, SpO2 100 %. ?Incision/Wound:c/d/i ?No bleeding or hematoma. ? ?Disposition: Discharge disposition: 01-Home or Self Care ? ? ? ? ? ? ?Discharge Instructions   ? ? Activity as tolerated - No restrictions   Complete by: As directed ?  ? Diet general   Complete by: As directed ?  ? No wound care   Complete by: As directed ?  ? ?  ? ?Allergies as of 03/16/2022   ?No Known Allergies ?  ? ?  ?Medication List  ?  ? ?STOP taking these medications   ? ?ibuprofen 100 MG/5ML suspension ?Commonly known as: ADVIL ?  ? ?  ? ?TAKE these medications   ? ?acetaminophen 500 MG tablet ?Commonly known as: TYLENOL ?Take 500-1,000 mg by mouth every 6 (six) hours as needed for mild pain or headache (or migraines). ?  ?albuterol 108 (90 Base) MCG/ACT inhaler ?Commonly known as: VENTOLIN HFA ?Inhale 1-2 puffs into the lungs every 6 (six) hours as needed for wheezing or shortness of breath. ?  ?amoxicillin 875 MG tablet ?Commonly known as: AMOXIL ?Take 1 tablet (875 mg total) by mouth 2 (two) times daily for 3 days. ?  ?amphetamine-dextroamphetamine 10 MG 24 hr capsule ?Commonly known as: Adderall XR ?Take 1 capsule (10 mg total) by mouth daily. ?  ?hydrOXYzine 10 MG/5ML syrup ?Commonly known as: ATARAX ?Take 12.5 mLs (25 mg total) by mouth 3 (three)  times daily as needed for anxiety. ?  ?oxyCODONE-acetaminophen 5-325 MG tablet ?Commonly known as: Percocet ?Take 1 tablet by mouth every 4 (four) hours as needed for up to 3 days for severe pain. ?  ?tiZANidine 2 MG tablet ?Commonly known as: ZANAFLEX ?Take 1-2 tablets (2-4 mg total) by mouth every 6 (six) hours as needed for muscle spasms. ?  ? ?  ? ? Follow-up Information   ? ? Leta Baptist, MD Follow up on 03/22/2022.   ?Specialty: Otolaryngology ?Why: at 4pm ?Contact information: ?[address] ?[address] ?[place] [address] ?[phone] ? ? ?  ?  ? ?  ?  ? ?  ? ? ?Signed: ?Jayleah Garbers W Naiah Donahoe ?03/16/2022, 6:45 AM ? ? ?

## 2022-03-16 NOTE — Discharge Instructions (Signed)
Thyroidectomy, Care After ?This sheet gives you information about how to care for yourself after your procedure. Your health care provider may also give you more specific instructions. If you have problems or questions, contact your health care provider. ?What can I expect after the procedure? ?After the procedure, it is common to have: ?Mild pain in the neck or upper body, especially when swallowing. ?A swollen neck. ?A sore throat. ?A weak or hoarse voice. ?Slight tingling or numbness around your mouth, or in your fingers or toes. This may last for a day or two after surgery. This condition is caused by low levels of calcium. You may be given calcium supplements to treat it. ?Follow these instructions at home: ?Medicines ?Take over-the-counter and prescription medicines only as told by your health care provider. ?Do not drive or use heavy machinery while taking prescription pain medicine. ?Do not take medicines that contain aspirin and ibuprofen until your health care provider says that you can. These medicines can increase your risk of bleeding. ?Eating and drinking ?Start slowly with eating. You may need to have only liquids and soft foods for a few days or as directed by your health care provider. ?To prevent or treat constipation while you are taking prescription pain medicine, your health care provider may recommend that you: ?Drink enough fluid to keep your urine pale yellow. ?Eat foods that are high in fiber, such as fresh fruits and vegetables, whole grains, and beans. ?Limit foods that are high in fat and processed sugars, such as fried and sweet foods. ?Incision care ?Follow instructions from your health care provider about how to take care of your incision. Make sure you: ?Leave stitches (sutures), skin glue, or adhesive strips in place. These skin closures may need to stay in place for 2 weeks or longer. If adhesive strip edges start to loosen and curl up, you may trim the loose edges. Do not remove  adhesive strips completely unless your health care provider tells you to do that. ?Check your incision area every day for signs of infection. Check for: ?Redness, swelling, or pain. ?Fluid or blood. ?Warmth. ?Pus or a bad smell. ?Activity ?For the first 10 days after the procedure or as instructed by your health care provider: ?Do not lift anything that is heavier than 10 lb (4.5 kg). ?Do not jog, swim, or do other strenuous exercises. ?Do not play contact sports. ?Avoid sitting for a long time without moving. Get up to take short walks every 1-2 hours. This is needed to improve blood flow and breathing. Ask for help if you feel weak or unsteady. ?Return to your normal activities as told by your health care provider. Ask your health care provider what activities are safe for you. ?General instructions ?Do not use any products that contain nicotine or tobacco, such as cigarettes and e-cigarettes. These can delay healing after surgery. If you need help quitting, ask your health care provider. ?Keep all follow-up visits as told by your health care provider. This is important. Your health care provider needs to monitor the calcium level in your blood to make sure that it does not become low. ?Contact a health care provider if you: ?Have a fever. ?Have more redness, swelling, or pain around your incision area. ?Have fluid or blood coming from your incision area. ?Notice that your incision area feels warm to the touch. ?Have pus or a bad smell coming from your incision area. ?Have trouble talking. ?Have nausea or vomiting for more than 2 days. ?Get  help right away if you: ?Have trouble breathing. ?Have trouble swallowing. ?Develop a rash. ?Develop a cough that gets worse. ?Notice that your speech changes, or you have hoarseness that gets worse. ?Develop numbness, tingling, or muscle spasms in the arms, hands, feet, or face. ?Summary ?After the procedure, it is common to feel mild pain in the neck or upper body, especially  when swallowing. ?Take medicines as told by your health care provider. These include pain medicines and thyroid hormones, if required. ?Follow instructions from your health care provider about how to take care of your incision. Watch for signs of infection. ?Keep all follow-up visits as told by your health care provider. This is important. Your health care provider needs to monitor the calcium level in your blood to make sure that it does not become low. ?Get help right away if you develop difficulty breathing, or numbness, tingling, or muscle spasms in the arms, hands, feet, or face. ?This information is not intended to replace advice given to you by your health care provider. Make sure you discuss any questions you have with your health care provider. ?Document Revised: 08/21/2020 Document Reviewed: 08/21/2020 ?Elsevier Patient Education ? [place]. ?

## 2022-03-17 ENCOUNTER — Encounter (HOSPITAL_BASED_OUTPATIENT_CLINIC_OR_DEPARTMENT_OTHER): Payer: Self-pay | Admitting: Otolaryngology

## 2022-09-08 ENCOUNTER — Encounter: Payer: Self-pay | Admitting: Neurology
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
## 2022-09-30 ENCOUNTER — Encounter: Payer: Self-pay | Admitting: Neurology

## 2022-09-30 ENCOUNTER — Ambulatory Visit (INDEPENDENT_AMBULATORY_CARE_PROVIDER_SITE_OTHER): Payer: BC Managed Care – PPO | Admitting: Neurology

## 2022-09-30 DIAGNOSIS — R252 Cramp and spasm: Secondary | ICD-10-CM | POA: Diagnosis not present

## 2022-09-30 DIAGNOSIS — R209 Unspecified disturbances of skin sensation: Secondary | ICD-10-CM

## 2022-09-30 DIAGNOSIS — R202 Paresthesia of skin: Secondary | ICD-10-CM

## 2022-09-30 DIAGNOSIS — R251 Tremor, unspecified: Secondary | ICD-10-CM

## 2022-09-30 DIAGNOSIS — M79601 Pain in right arm: Secondary | ICD-10-CM

## 2022-09-30 DIAGNOSIS — R200 Anesthesia of skin: Secondary | ICD-10-CM

## 2022-09-30 DIAGNOSIS — R29898 Other symptoms and signs involving the musculoskeletal system: Secondary | ICD-10-CM

## 2022-09-30 NOTE — Patient Instructions (Signed)
I saw you today for the pain in your arms and shaking/tremors (mostly in the right arm).  I am not sure the cause of your symptoms, but it does not look like anything like Parkinsons or a stroke. It may be a pinched nerve in your neck or arm.  I would like to do a muscle and nerve test called an EMG to evaluate further (more information below). You can schedule this before you leave today.  I will discuss next steps after this test.  Please let me know if you have any questions or concerns in the meantime.  The physicians and staff at [facility] are committed to providing excellent care. You may receive a survey requesting feedback about your experience at our office. We strive to receive "very good" responses to the survey questions. If you feel that your experience would prevent you from giving the office a "very good " response, please contact our office to try to remedy the situation. We may be reached at [phone]. Thank you for taking the time out of your busy day to complete the survey.  Kai Levins, MD [facility]  ELECTROMYOGRAM AND NERVE CONDUCTION STUDIES (EMG/NCS) INSTRUCTIONS  How to Prepare The neurologist conducting the EMG will need to know if you have certain medical conditions. Tell the neurologist and other EMG lab personnel if you: Have a pacemaker or any other electrical medical device Take blood-thinning medications Have hemophilia, a blood-clotting disorder that causes prolonged bleeding Bathing Take a shower or bath shortly before your exam in order to remove oils from your skin. Don't apply lotions or creams before the exam.  What to Expect You'll likely be asked to change into a hospital gown for the procedure and lie down on an examination table. The following explanations can help you understand what will happen during the exam.  Electrodes. The neurologist or a technician places surface electrodes at various locations on your skin depending on  where you're experiencing symptoms. Or the neurologist may insert needle electrodes at different sites depending on your symptoms.  Sensations. The electrodes will at times transmit a tiny electrical current that you may feel as a twinge or spasm. The needle electrode may cause discomfort or pain that usually ends shortly after the needle is removed. If you are concerned about discomfort or pain, you may want to talk to the neurologist about taking a short break during the exam.  Instructions. During the needle EMG, the neurologist will assess whether there is any spontaneous electrical activity when the muscle is at rest - activity that isn't present in healthy muscle tissue - and the degree of activity when you slightly contract the muscle.  He or she will give you instructions on resting and contracting a muscle at appropriate times. Depending on what muscles and nerves the neurologist is examining, he or she may ask you to change positions during the exam.  After your EMG You may experience some temporary, minor bruising where the needle electrode was inserted into your muscle. This bruising should fade within several days. If it persists, contact your primary care doctor.

## 2022-10-18 ENCOUNTER — Telehealth: Payer: Self-pay | Admitting: Neurology

## 2022-10-18 ENCOUNTER — Ambulatory Visit (INDEPENDENT_AMBULATORY_CARE_PROVIDER_SITE_OTHER): Payer: BC Managed Care – PPO | Admitting: Neurology

## 2022-10-18 DIAGNOSIS — R209 Unspecified disturbances of skin sensation: Secondary | ICD-10-CM

## 2022-10-18 DIAGNOSIS — M79601 Pain in right arm: Secondary | ICD-10-CM

## 2022-10-18 DIAGNOSIS — R251 Tremor, unspecified: Secondary | ICD-10-CM

## 2022-10-18 DIAGNOSIS — R252 Cramp and spasm: Secondary | ICD-10-CM

## 2022-10-18 NOTE — Procedures (Signed)
[facility]  [address] [place], [address]  [place], [place] [address] Tel: [phone] Fax: [phone] Test Date:  10/18/2022  Patient: Donna Dorsey DOB: 11-09-1981 Physician: Jacquelyne Balint, MD  Sex: Female Height: 5\' 4"  Ref Phys: Jacquelyne Balint, MD  ID#: [id number]   Technician:    History: This is a 41 year old female with right arm numbness and tingling.  NCV & EMG Findings: Extensive electrodiagnostic evaluation of the right upper limb shows: Right median, ulnar, and radial sensory responses are within normal limits. Right median and ulnar motor responses are within normal limits. There is no evidence of active or chronic motor axon loss changes affecting any of the tested muscles. Motor unit configuration and recruitment pattern is within normal limits.  Impression: This is a normal electrodiagnostic evaluation of the right upper limb. Specifically: No electrodiagnostic evidence of a right cervical (C5-C8) motor radiculopathy. No electrodiagnostic evidence of a right median mononeuropathy, including carpal tunnel syndrome. No electrodiagnostic evidence of a right ulnar mononeuropathy. Screening studies for a radial mononeuropathy are normal.    ___________________________ Jacquelyne Balint, MD    Nerve Conduction Studies Motor Nerve Results    Latency Amplitude F-Lat Segment Distance CV Comment  Site (ms) Norm (mV) Norm (ms)  (cm) (m/s) Norm   Right Median (APB) Motor  Wrist 2.5  < 3.9 12.3  > 6.0        Elbow 6.8 - 10.5 -  Elbow-Wrist 26.5 62  > 50   Right Ulnar (ADM) Motor  Wrist 1.95  < 3.1 9.2  > 7.0        Bel elbow 5.3 - 7.7 -  Bel elbow-Wrist 23 68  > 50   Ab elbow 7.1 - 7.7 -  Ab elbow-Bel elbow 10 56 -    Sensory Sites    Neg Peak Lat Amplitude (O-P) Segment Distance Velocity Comment  Site (ms) Norm (V) Norm  (cm) (ms)   Right Median Sensory  Wrist-Dig II 2.6  < 3.4 40  > 20 Wrist-Dig II 13    Right Radial Sensory  Forearm-Wrist 2.0  < 2.7  27  > 18 Forearm-Wrist 10    Right Ulnar Sensory  Wrist-Dig V 2.5  < 3.1 40  > 12 Wrist-Dig V 11     Electromyography   Side Muscle Ins.Act Fibs Fasc Recrt Amp Dur Poly Activation Comment  Right FDI Nml Nml Nml Nml Nml Nml Nml Nml N/A  Right EIP Nml Nml Nml Nml Nml Nml Nml Nml N/A  Right Pronator teres Nml Nml Nml Nml Nml Nml Nml Nml N/A  Right Biceps Nml Nml Nml Nml Nml Nml Nml Nml N/A  Right Triceps Nml Nml Nml Nml Nml Nml Nml Nml N/A  Right Deltoid Nml Nml Nml Nml Nml Nml Nml Nml N/A      Waveforms:  Motor      Sensory

## 2022-10-18 NOTE — Telephone Encounter (Signed)
Discussed the results of her EMG after the procedure today. Her EMG of the right upper extremity was normal. I explained that her symptoms were unlikely due to a muscle or nerve problem.  All questions were answered.  Kai Levins, MD [facility]

## 2022-10-19 ENCOUNTER — Other Ambulatory Visit: Payer: Self-pay

## 2022-10-19 ENCOUNTER — Telehealth: Payer: Self-pay | Admitting: Neurology

## 2022-10-19 DIAGNOSIS — R29898 Other symptoms and signs involving the musculoskeletal system: Secondary | ICD-10-CM

## 2022-10-19 DIAGNOSIS — R200 Anesthesia of skin: Secondary | ICD-10-CM

## 2022-10-19 NOTE — Telephone Encounter (Signed)
I called patient to discuss next steps after her normal EMG yesterday. I explained that we also needed to make sure there were no abnormalities of her brain or cervical spine that could explain her symptoms, particularly demyelinating disease. Patient was in agreement.  I will order today. Patient to call us if she has not heard from MRI scheduling in the next 1-2 weeks.  All questions were answered.  Kai Levins, MD [facility]

## 2022-11-10 ENCOUNTER — Ambulatory Visit: Payer: BC Managed Care – PPO | Admitting: Neurology

## 2022-11-22 ENCOUNTER — Encounter: Payer: Self-pay | Admitting: Neurology

## 2022-11-23 ENCOUNTER — Other Ambulatory Visit: Payer: Self-pay | Admitting: Neurology

## 2022-11-23 ENCOUNTER — Ambulatory Visit
Admission: RE | Admit: 2022-11-23 | Discharge: 2022-11-23 | Disposition: A | Discharge status: Home / Self Care | Payer: BC Managed Care – PPO | Source: Ambulatory Visit | Attending: Neurology | Admitting: Neurology

## 2022-11-23 DIAGNOSIS — R200 Anesthesia of skin: Secondary | ICD-10-CM

## 2022-11-23 DIAGNOSIS — R29898 Other symptoms and signs involving the musculoskeletal system: Secondary | ICD-10-CM

## 2022-11-29 ENCOUNTER — Encounter: Payer: BC Managed Care – PPO | Admitting: Neurology

## 2023-08-12 ENCOUNTER — Other Ambulatory Visit: Payer: Self-pay | Admitting: Obstetrics & Gynecology

## 2023-08-12 DIAGNOSIS — D259 Leiomyoma of uterus, unspecified: Secondary | ICD-10-CM

## 2023-09-05 ENCOUNTER — Ambulatory Visit
Admission: RE | Admit: 2023-09-05 | Discharge: 2023-09-05 | Disposition: A | Discharge status: Home / Self Care | Payer: BC Managed Care – PPO | Source: Ambulatory Visit | Attending: Obstetrics & Gynecology | Admitting: Obstetrics & Gynecology

## 2023-09-05 DIAGNOSIS — D259 Leiomyoma of uterus, unspecified: Secondary | ICD-10-CM

## 2023-09-05 MED ORDER — GADOPICLENOL 0.5 MMOL/ML IV SOLN
8.0000 mL | Freq: Once | INTRAVENOUS | Status: AC | PRN
  Administered 2023-09-05: 8 mL via INTRAVENOUS

## 2023-09-29 ENCOUNTER — Other Ambulatory Visit: Payer: Self-pay | Admitting: Obstetrics & Gynecology

## 2023-10-07 ENCOUNTER — Encounter (HOSPITAL_BASED_OUTPATIENT_CLINIC_OR_DEPARTMENT_OTHER): Payer: Self-pay | Admitting: Obstetrics & Gynecology

## 2023-10-08 IMAGING — US US FNA BIOPSY THYROID 1ST LESION
1 series · 11 of 11 positions shown · non-contrast
Comparison: US Thyroid, 10/23/2021 and 11/14/2020. US FNA biopsy,
12/04/2019.

MEDICATIONS:
None

COMPLICATIONS:
None immediate.

INDICATION: Interval growth, previously biopsied thyroid nodule

EXAM:
ULTRASOUND GUIDED FINE NEEDLE ASPIRATION OF INDETERMINATE THYROID
NODULE
TECHNIQUE: Informed written consent was obtained from the patient after a
discussion of the risks, benefits and alternatives to treatment.
Questions regarding the procedure were encouraged and answered. A
timeout was performed prior to the initiation of the procedure.

[Series 1: us fna biopsy thyroid 1st lesion · 0.07mm/px · 11 acquisitions, 11 frames shown]
[im 1/11]
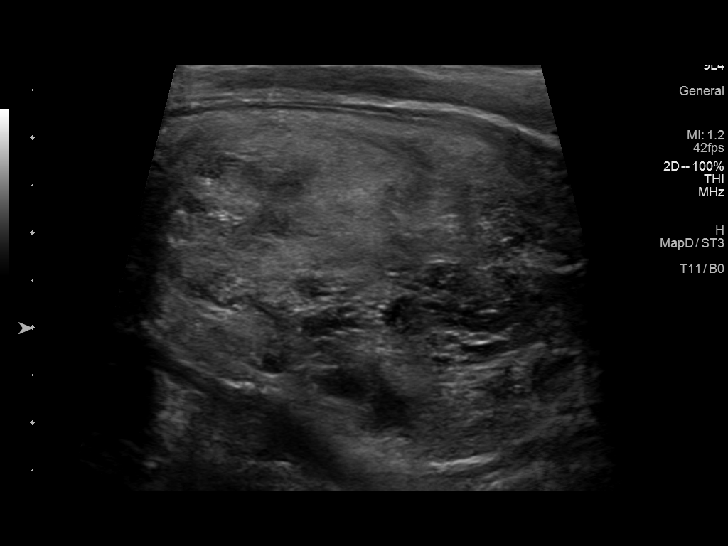
[im 2/11]
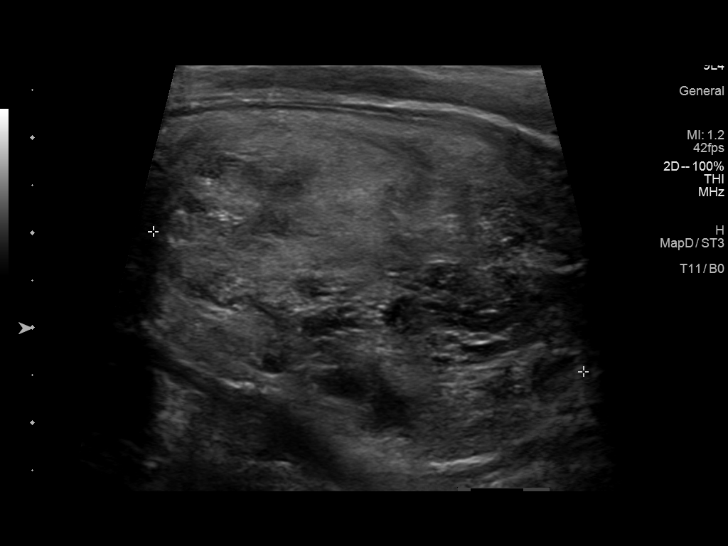
[im 3/11]
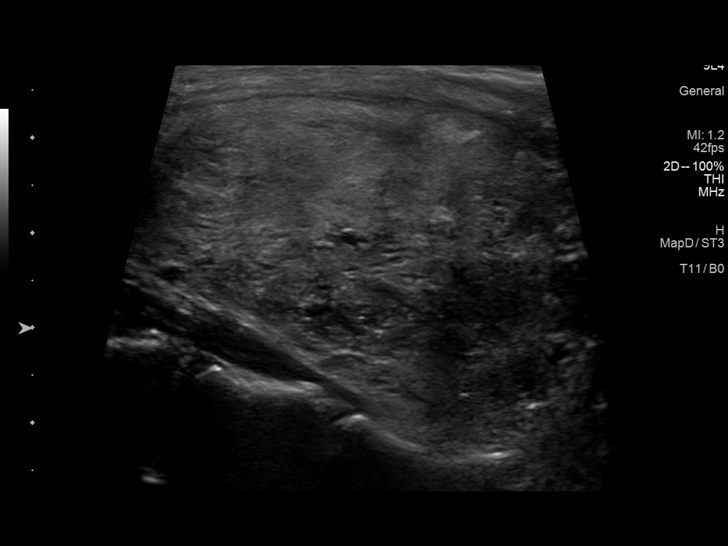
[im 4/11]
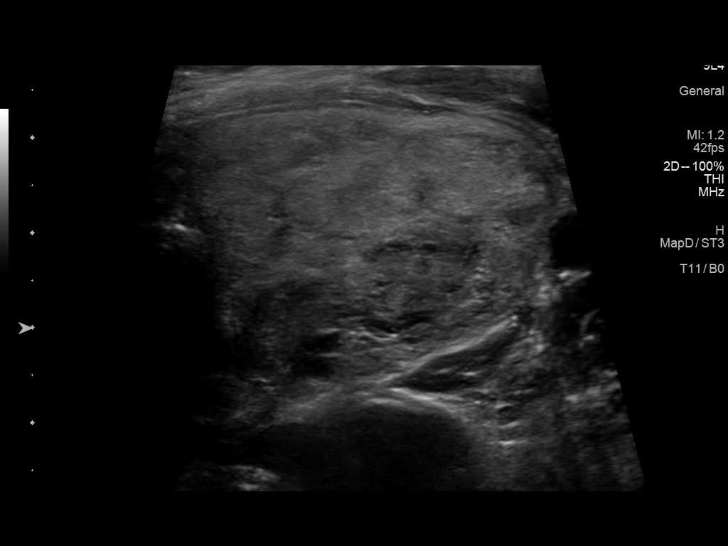
[im 5/11]
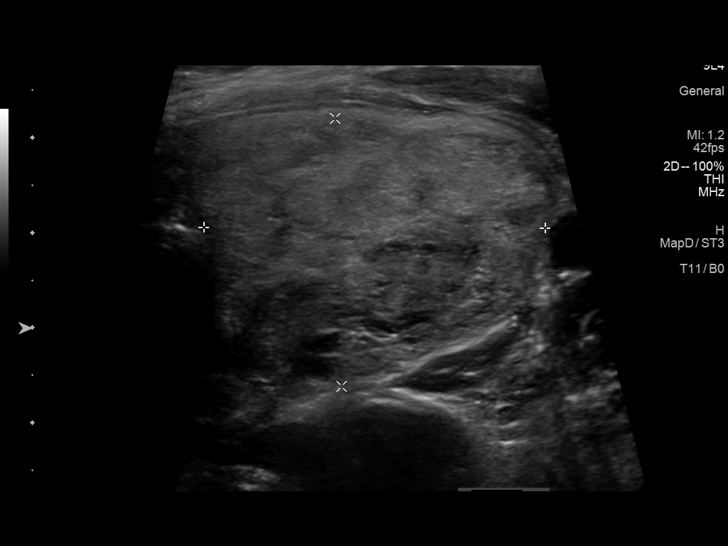
[im 6/11]
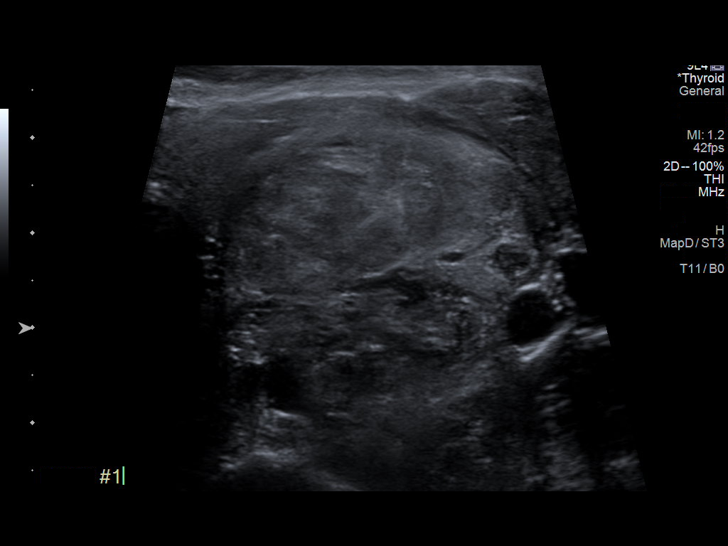
[im 7/11]
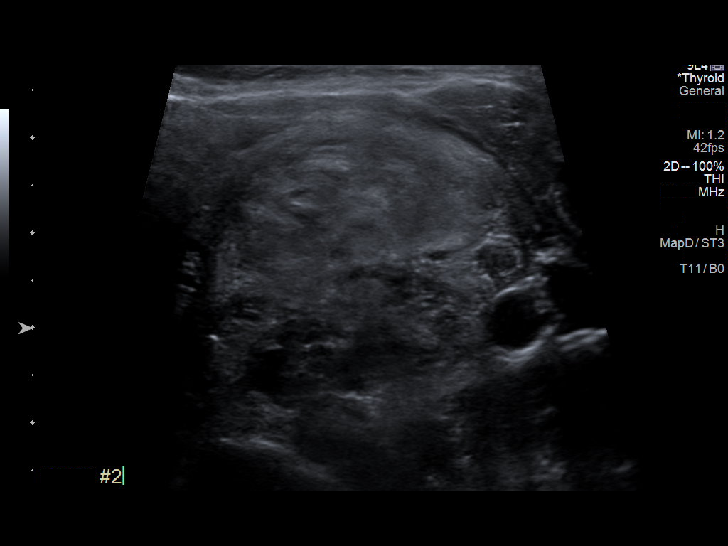
[im 8/11]
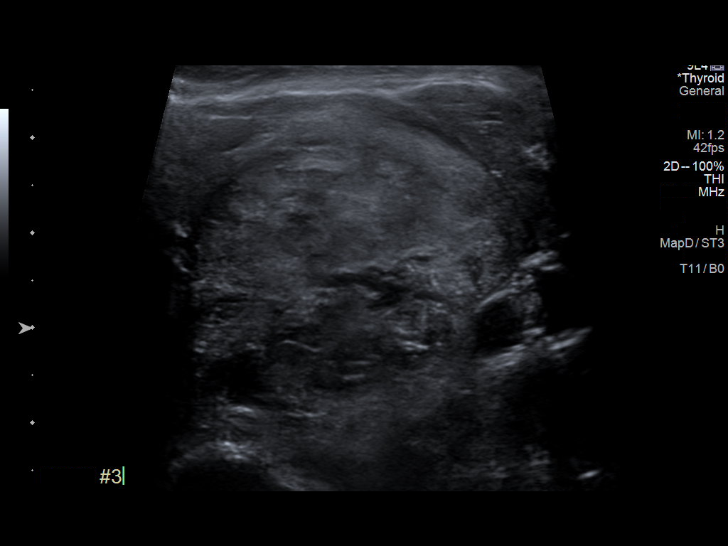
[im 9/11]
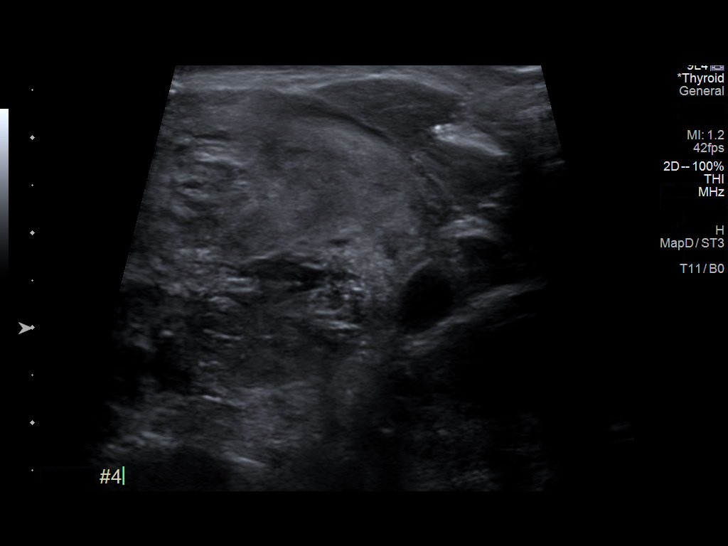
[im 10/11]
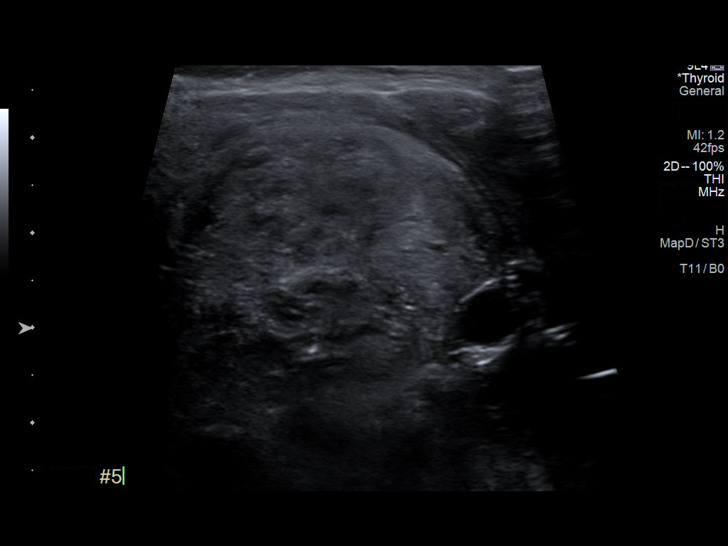
[im 11/11]
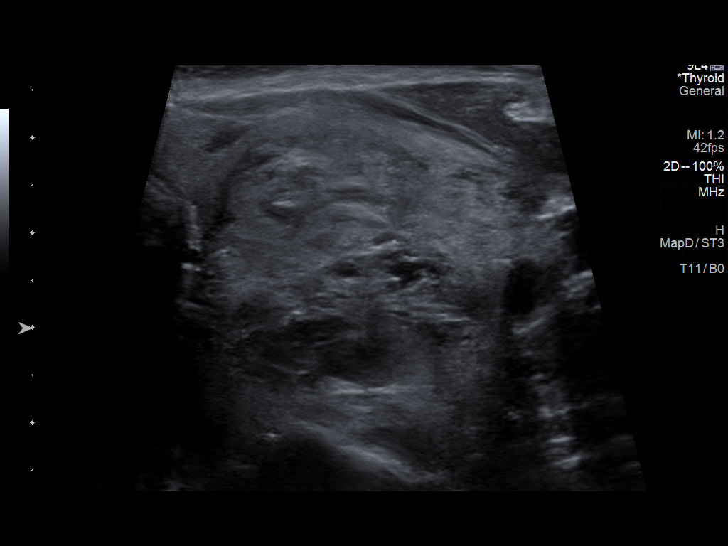

[11 of 11 positions shown; findings below may reference images not displayed]

Pre-procedural ultrasound scanning demonstrated relatively similar
size and appearance of the dominant LEFT mid/inferior TR-3 thyroid
nodule, previously measured up to 5.1 cm (10/23/2021). The nodule
appears to be heterogeneous in echogenicity, with a superior solid
component and deep/inferior spongiform component. On US FNA biopsy
dated 12/04/2019, it appears the solid component was biopsied with
pathology revealing benign follicular nodule ([REDACTED] II).

The procedure was planned. The neck was prepped in the usual sterile
fashion, and a sterile drape was applied covering the operative
field. A timeout was performed prior to the initiation of the
procedure. Local anesthesia was provided with 1% lidocaine.

Under direct ultrasound guidance, 5 FNA biopsies were performed of
the dominant LEFT TR-3 nodule with a 25 gauge needle. Multiple
ultrasound images were saved for procedural documentation purposes.
The samples were prepared and submitted to pathology.

Limited post procedural scanning was negative for hematoma or
additional complication. Dressings were placed. The patient
tolerated the above procedures procedure well without immediate
postprocedural complication.
FINDINGS: Nodule reference number based on prior diagnostic ultrasound: 1

Maximum size: 5.1 cm

Location: LEFT; mid/inferior

ACR TI-RADS risk category: TR-3

Reason for biopsy: other, interval growth despite previous biopsy
with benign result.

Ultrasound imaging confirms appropriate placement of the needles
within the thyroid nodule.
IMPRESSION: Successful ultrasound-guided repeat fine needle aspiration of 5.1 cm
LEFT mid/inferior TR-3 thyroid nodule, as above.

## 2023-10-11 ENCOUNTER — Encounter (HOSPITAL_BASED_OUTPATIENT_CLINIC_OR_DEPARTMENT_OTHER): Payer: Self-pay | Admitting: Obstetrics & Gynecology

## 2023-10-11 ENCOUNTER — Other Ambulatory Visit: Payer: Self-pay

## 2023-10-11 NOTE — Progress Notes (Signed)
Spoke w/ via phone for pre-op interview---Danika Lab needs dos---- UPT        Lab results------10/17/23 lab appt for cbc, bmp, type & screen COVID test -----patient states asymptomatic no test needed Arrive at -------1130 on Thursday, 10/20/23 NPO after MN NO Solid Food.  Clear liquids from MN until---1030 Med rec completed Medications to take morning of surgery -----Wellbutrin Diabetic medication -----n/a Patient instructed no nail polish to be worn day of surgery Patient instructed to bring photo id and insurance card day of surgery Patient aware to have Driver (ride ) / caregiver    for 24 hours after surgery - husband, Kendrick Patient Special Instructions -----Extended / overnight stay instructions given. Pre-Op special Instructions -----none Patient verbalized understanding of instructions that were given at this phone interview. Patient denies chest pain, sob, fever, cough at the interview.

## 2023-10-11 NOTE — Progress Notes (Signed)
Your procedure is scheduled on Thursday, 10/20/23.  Report to [facility] [place] AT  11:30 AM.   Call this number if you have problems the morning of surgery  [phone].   OUR ADDRESS IS [address].  WE ARE LOCATED IN [facility].  PLEASE BRING YOUR INSURANCE CARD AND PHOTO ID DAY OF SURGERY.  ONLY 2 PEOPLE ARE ALLOWED IN  WAITING  ROOM                                      REMEMBER:  DO NOT EAT FOOD, CANDY GUM OR MINTS  AFTER MIDNIGHT THE NIGHT BEFORE YOUR SURGERY . YOU MAY HAVE CLEAR LIQUIDS FROM MIDNIGHT THE NIGHT BEFORE YOUR SURGERY UNTIL  10:30 AM. NO CLEAR LIQUIDS AFTER   10:30 AM DAY OF SURGERY.  YOU MAY  BRUSH YOUR TEETH MORNING OF SURGERY AND RINSE YOUR MOUTH OUT, NO CHEWING GUM CANDY OR MINTS.     CLEAR LIQUID DIET    Allowed      Water                                                                   Coffee and tea, regular and decaf  (NO cream or milk products of any type, may sweeten)                         Carbonated beverages, regular and diet                                    Sports drinks like Gatorade _____________________________________________________________________     TAKE ONLY THESE MEDICATIONS MORNING OF SURGERY: Wellbutrin                                        DO NOT WEAR JEWERLY/  METAL/  PIERCINGS (INCLUDING NO PLASTIC PIERCINGS) DO NOT WEAR LOTIONS, POWDERS, PERFUMES OR NAIL POLISH ON YOUR FINGERNAILS. TOENAIL POLISH IS OK TO WEAR. DO NOT SHAVE FOR 48 HOURS PRIOR TO DAY OF SURGERY.  CONTACTS, GLASSES, OR DENTURES MAY NOT BE WORN TO SURGERY.  REMEMBER: NO SMOKING, VAPING ,  DRUGS OR ALCOHOL FOR 24 HOURS BEFORE YOUR SURGERY.                                    [facility] IS NOT RESPONSIBLE  FOR ANY BELONGINGS.                                                                    Marland Kitchen           [facility] - Preparing for Surgery Before surgery,  you can play an important role.  Because skin is not  sterile, your skin needs to be as free of germs as possible.  You can reduce the number of germs on your skin by washing with CHG (chlorahexidine gluconate) soap before surgery.  CHG is an antiseptic cleaner which kills germs and bonds with the skin to continue killing germs even after washing. Please DO NOT use if you have an allergy to CHG or antibacterial soaps.  If your skin becomes reddened/irritated stop using the CHG and inform your nurse when you arrive at Short Stay. Do not shave (including legs and underarms) for at least 48 hours prior to the first CHG shower.  You may shave your face/neck. Please follow these instructions carefully:  1.  Shower with CHG Soap the night before surgery and the  morning of Surgery.  2.  If you choose to wash your hair, wash your hair first as usual with your  normal  shampoo.  3.  After you shampoo, rinse your hair and body thoroughly to remove the  shampoo.                                        4.  Use CHG as you would any other liquid soap.  You can apply chg directly  to the skin and wash , chg soap provided, night before and morning of your surgery.  5.  Apply the CHG Soap to your body ONLY FROM THE NECK DOWN.   Do not use on face/ open                           Wound or open sores. Avoid contact with eyes, ears mouth and genitals (private parts).                       Wash face,  Genitals (private parts) with your normal soap.             6.  Wash thoroughly, paying special attention to the area where your surgery  will be performed.  7.  Thoroughly rinse your body with warm water from the neck down.  8.  DO NOT shower/wash with your normal soap after using and rinsing off  the CHG Soap.             9.  Pat yourself dry with a clean towel.            10.  Wear clean pajamas.            11.  Place clean sheets on your bed the night of your first shower and do not  sleep with pets. Day of Surgery : Do not apply any lotions/ powders the morning of  surgery.  Please wear clean clothes to the hospital/surgery center.  IF YOU HAVE ANY SKIN IRRITATION OR PROBLEMS WITH THE SURGICAL SOAP, PLEASE GET A BAR OF GOLD DIAL SOAP AND SHOWER THE NIGHT BEFORE YOUR SURGERY AND THE MORNING OF YOUR SURGERY. PLEASE LET THE NURSE KNOW MORNING OF YOUR SURGERY IF YOU HAD ANY PROBLEMS WITH THE SURGICAL SOAP.   YOUR SURGEON MAY HAVE REQUESTED EXTENDED RECOVERY TIME AFTER YOUR SURGERY. IT COULD BE A  JUST A FEW HOURS  UP TO AN OVERNIGHT STAY.  YOUR SURGEON SHOULD HAVE DISCUSSED THIS WITH YOU PRIOR TO YOUR SURGERY.  IN THE EVENT YOU NEED TO STAY OVERNIGHT PLEASE REFER TO THE FOLLOWING GUIDELINES. YOU MAY HAVE UP TO 4 VISITORS  MAY VISIT IN THE EXTENDED RECOVERY ROOM UNTIL 800 PM ONLY.  ONE  VISITOR AGE 37 AND OVER MAY SPEND THE NIGHT AND MUST BE IN EXTENDED RECOVERY ROOM NO LATER THAN 800 PM . YOUR DISCHARGE TIME AFTER YOU SPEND THE NIGHT IS 900 AM THE MORNING AFTER YOUR SURGERY. YOU MAY PACK A SMALL OVERNIGHT BAG WITH TOILETRIES FOR YOUR OVERNIGHT STAY IF YOU WISH.  REGARDLESS OF IF YOU STAY OVER NIGHT OR ARE DISCHARGED THE SAME DAY YOU WILL BE REQUIRED TO HAVE A RESPONSIBLE ADULT (18 YRS OLD OR OLDER) STAY WITH YOU FOR AT LEAST THE FIRST 24 HOURS  YOUR PRESCRIPTION MEDICATIONS WILL BE PROVIDED DURING YOUR HOSPITAL STAY.  ________________________________________________________________________                                                        QUESTIONS Mechele Claude PRE OP NURSE PHONE [phone].

## 2023-10-17 ENCOUNTER — Encounter (HOSPITAL_COMMUNITY)
Admission: RE | Admit: 2023-10-17 | Discharge: 2023-10-17 | Disposition: A | Discharge status: Home / Self Care | Payer: BC Managed Care – PPO | Source: Ambulatory Visit | Attending: Obstetrics & Gynecology | Admitting: Obstetrics & Gynecology

## 2023-10-17 DIAGNOSIS — Z01812 Encounter for preprocedural laboratory examination: Secondary | ICD-10-CM | POA: Insufficient documentation

## 2023-10-17 LAB — CBC
HCT: 38.8 % (ref 36.0–46.0)
Hemoglobin: 12.7 g/dL (ref 12.0–15.0)
MCH: 29.5 pg (ref 26.0–34.0)
MCHC: 32.7 g/dL (ref 30.0–36.0)
MCV: 90.2 fL (ref 80.0–100.0)
Platelets: 275 10*3/uL (ref 150–400)
RBC: 4.3 MIL/uL (ref 3.87–5.11)
RDW: 13.2 % (ref 11.5–15.5)
WBC: 5.5 10*3/uL (ref 4.0–10.5)
nRBC: 0 % (ref 0.0–0.2)

## 2023-10-17 LAB — BASIC METABOLIC PANEL
Anion gap: 9 (ref 5–15)
BUN: 10 mg/dL (ref 6–20)
CO2: 26 mmol/L (ref 22–32)
Calcium: 9.3 mg/dL (ref 8.9–10.3)
Chloride: 99 mmol/L (ref 98–111)
Creatinine, Ser: 0.89 mg/dL (ref 0.44–1.00)
GFR, Estimated: >60 mL/min (ref >60)
Glucose, Bld: 94 mg/dL (ref 70–99)
Potassium: 3.7 mmol/L (ref 3.5–5.1)
Sodium: 134 mmol/L — ABNORMAL LOW (ref 135–145)
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
## 2023-10-20 ENCOUNTER — Other Ambulatory Visit: Payer: Self-pay

## 2023-10-20 ENCOUNTER — Ambulatory Visit (HOSPITAL_BASED_OUTPATIENT_CLINIC_OR_DEPARTMENT_OTHER): Payer: BC Managed Care – PPO | Admitting: Anesthesiology

## 2023-10-20 ENCOUNTER — Encounter (HOSPITAL_BASED_OUTPATIENT_CLINIC_OR_DEPARTMENT_OTHER): Payer: Self-pay | Admitting: Obstetrics & Gynecology

## 2023-10-20 ENCOUNTER — Encounter (HOSPITAL_COMMUNITY)
Admission: RE | Disposition: A | Discharge status: Home / Self Care | Payer: Self-pay | Source: Ambulatory Visit | Attending: Obstetrics & Gynecology

## 2023-10-20 ENCOUNTER — Inpatient Hospital Stay (HOSPITAL_BASED_OUTPATIENT_CLINIC_OR_DEPARTMENT_OTHER)
Admission: RE | Admit: 2023-10-20 | Discharge: 2023-10-21 | DRG: 743 | Disposition: A | Discharge status: Home / Self Care | Payer: BC Managed Care – PPO | Source: Ambulatory Visit | Attending: Obstetrics & Gynecology | Admitting: Obstetrics & Gynecology

## 2023-10-20 DIAGNOSIS — Z8249 Family history of ischemic heart disease and other diseases of the circulatory system: Secondary | ICD-10-CM

## 2023-10-20 DIAGNOSIS — Z820 Family history of epilepsy and other diseases of the nervous system: Secondary | ICD-10-CM | POA: Diagnosis not present

## 2023-10-20 DIAGNOSIS — E785 Hyperlipidemia, unspecified: Secondary | ICD-10-CM | POA: Diagnosis present

## 2023-10-20 DIAGNOSIS — Z8349 Family history of other endocrine, nutritional and metabolic diseases: Secondary | ICD-10-CM

## 2023-10-20 DIAGNOSIS — Z79899 Other long term (current) drug therapy: Secondary | ICD-10-CM | POA: Diagnosis not present

## 2023-10-20 DIAGNOSIS — E890 Postprocedural hypothyroidism: Secondary | ICD-10-CM | POA: Diagnosis present

## 2023-10-20 DIAGNOSIS — R7303 Prediabetes: Secondary | ICD-10-CM | POA: Diagnosis present

## 2023-10-20 DIAGNOSIS — Z87820 Personal history of traumatic brain injury: Secondary | ICD-10-CM

## 2023-10-20 DIAGNOSIS — Z833 Family history of diabetes mellitus: Secondary | ICD-10-CM | POA: Diagnosis not present

## 2023-10-20 DIAGNOSIS — Z90710 Acquired absence of both cervix and uterus: Secondary | ICD-10-CM | POA: Diagnosis present

## 2023-10-20 DIAGNOSIS — D252 Subserosal leiomyoma of uterus: Principal | ICD-10-CM | POA: Diagnosis present

## 2023-10-20 DIAGNOSIS — D259 Leiomyoma of uterus, unspecified: Secondary | ICD-10-CM | POA: Diagnosis present

## 2023-10-20 DIAGNOSIS — K219 Gastro-esophageal reflux disease without esophagitis: Secondary | ICD-10-CM | POA: Diagnosis present

## 2023-10-20 DIAGNOSIS — Z01818 Encounter for other preprocedural examination: Principal | ICD-10-CM

## 2023-10-20 HISTORY — DX: Prediabetes: R73.03

## 2023-10-20 HISTORY — DX: Paresthesia of skin: R20.0

## 2023-10-20 HISTORY — DX: Hyperlipidemia, unspecified: E78.5

## 2023-10-20 HISTORY — DX: Vitamin D deficiency, unspecified: E55.9

## 2023-10-20 HISTORY — DX: Presence of spectacles and contact lenses: Z97.3

## 2023-10-20 HISTORY — DX: Anesthesia of skin: R20.2

## 2023-10-20 HISTORY — DX: Migraine, unspecified, not intractable, without status migrainosus: G43.909

## 2023-10-20 HISTORY — DX: Gastro-esophageal reflux disease without esophagitis: K21.9

## 2023-10-20 HISTORY — PX: HYSTERECTOMY ABDOMINAL WITH SALPINGECTOMY: SHX6725

## 2023-10-20 LAB — ABO/RH: ABO/RH(D): B POS

## 2023-10-20 LAB — TYPE AND SCREEN
ABO/RH(D): B POS
Antibody Screen: NEGATIVE

## 2023-10-20 LAB — POCT PREGNANCY, URINE: Preg Test, Ur: NEGATIVE

## 2023-10-20 SURGERY — HYSTERECTOMY, TOTAL, ABDOMINAL, WITH SALPINGECTOMY
Anesthesia: General | Site: Abdomen

## 2023-10-20 MED ORDER — OXYCODONE HCL 5 MG PO TABS: 5.0000 mg | ORAL_TABLET | Freq: Once | ORAL | Status: DC | PRN

## 2023-10-20 MED ORDER — CLONIDINE HCL (ANALGESIA) 100 MCG/ML EP SOLN
EPIDURAL | Status: DC | PRN
  Administered 2023-10-20 (×2): 50 ug

## 2023-10-20 MED ORDER — ACETAMINOPHEN 500 MG PO TABS
1000.0000 mg | ORAL_TABLET | Freq: Once | ORAL | Status: AC
  Administered 2023-10-20: 1000 mg via ORAL

## 2023-10-20 MED ORDER — FENTANYL CITRATE (PF) 100 MCG/2ML IJ SOLN
INTRAMUSCULAR | Status: AC
  Filled 2023-10-20: qty 2

## 2023-10-20 MED ORDER — SUGAMMADEX SODIUM 200 MG/2ML IV SOLN
INTRAVENOUS | Status: DC | PRN
  Administered 2023-10-20: 200 mg via INTRAVENOUS

## 2023-10-20 MED ORDER — CEFAZOLIN SODIUM-DEXTROSE 2-4 GM/100ML-% IV SOLN
INTRAVENOUS | Status: AC
  Filled 2023-10-20: qty 100

## 2023-10-20 MED ORDER — LIDOCAINE 2% (20 MG/ML) 5 ML SYRINGE
INTRAMUSCULAR | Status: DC | PRN
  Administered 2023-10-20: 20 mg via INTRAVENOUS

## 2023-10-20 MED ORDER — ONDANSETRON HCL 4 MG/2ML IJ SOLN
INTRAMUSCULAR | Status: DC | PRN
  Administered 2023-10-20: 4 mg via INTRAVENOUS

## 2023-10-20 MED ORDER — BUPIVACAINE HCL (PF) 0.25 % IJ SOLN
INTRAMUSCULAR | Status: DC | PRN
  Administered 2023-10-20: 10 mL

## 2023-10-20 MED ORDER — DROPERIDOL 2.5 MG/ML IJ SOLN: 0.6250 mg | Freq: Once | INTRAMUSCULAR | Status: DC | PRN

## 2023-10-20 MED ORDER — CEFAZOLIN SODIUM-DEXTROSE 2-4 GM/100ML-% IV SOLN
2.0000 g | INTRAVENOUS | Status: AC
  Administered 2023-10-20: 2 g via INTRAVENOUS

## 2023-10-20 MED ORDER — ALBUMIN HUMAN 5 % IV SOLN
INTRAVENOUS | Status: AC
  Filled 2023-10-20: qty 250

## 2023-10-20 MED ORDER — KETOROLAC TROMETHAMINE 30 MG/ML IJ SOLN
INTRAMUSCULAR | Status: AC
  Filled 2023-10-20: qty 1

## 2023-10-20 MED ORDER — DEXTROSE-SODIUM CHLORIDE 5-0.45 % IV SOLN
INTRAVENOUS | Status: DC
Start: 2023-10-20 — End: 2023-10-21

## 2023-10-20 MED ORDER — MIDAZOLAM HCL 5 MG/5ML IJ SOLN
INTRAMUSCULAR | Status: DC | PRN
  Administered 2023-10-20: 2 mg via INTRAVENOUS

## 2023-10-20 MED ORDER — ACETAMINOPHEN 325 MG PO TABS: 650.0000 mg | ORAL_TABLET | ORAL | Status: DC | PRN

## 2023-10-20 MED ORDER — IBUPROFEN 200 MG PO TABS: 600.0000 mg | ORAL_TABLET | Freq: Four times a day (QID) | ORAL | Status: DC

## 2023-10-20 MED ORDER — ONDANSETRON HCL 4 MG/2ML IJ SOLN
INTRAMUSCULAR | Status: AC
  Filled 2023-10-20: qty 2

## 2023-10-20 MED ORDER — STERILE WATER FOR IRRIGATION IR SOLN
Status: DC | PRN
  Administered 2023-10-20: 500 mL

## 2023-10-20 MED ORDER — PROPOFOL 10 MG/ML IV BOLUS
INTRAVENOUS | Status: DC | PRN
  Administered 2023-10-20: 200 mg via INTRAVENOUS

## 2023-10-20 MED ORDER — DEXAMETHASONE SODIUM PHOSPHATE 10 MG/ML IJ SOLN
INTRAMUSCULAR | Status: AC
  Filled 2023-10-20: qty 1

## 2023-10-20 MED ORDER — PROPOFOL 10 MG/ML IV BOLUS
INTRAVENOUS | Status: AC
  Filled 2023-10-20: qty 20

## 2023-10-20 MED ORDER — ACETAMINOPHEN 500 MG PO TABS
ORAL_TABLET | ORAL | Status: AC
  Filled 2023-10-20: qty 2

## 2023-10-20 MED ORDER — KETOROLAC TROMETHAMINE 30 MG/ML IJ SOLN
INTRAMUSCULAR | Status: DC | PRN
  Administered 2023-10-20: 30 mg via INTRAVENOUS

## 2023-10-20 MED ORDER — LACTATED RINGERS IV SOLN: INTRAVENOUS | Status: DC

## 2023-10-20 MED ORDER — HYDROMORPHONE HCL 1 MG/ML IJ SOLN: 0.2500 mg | INTRAMUSCULAR | Status: DC | PRN

## 2023-10-20 MED ORDER — ONDANSETRON HCL 4 MG PO TABS
4.0000 mg | ORAL_TABLET | Freq: Four times a day (QID) | ORAL | Status: DC | PRN
  Administered 2023-10-20: 4 mg via ORAL
  Filled 2023-10-20: qty 1

## 2023-10-20 MED ORDER — KETAMINE HCL 50 MG/5ML IJ SOSY
PREFILLED_SYRINGE | INTRAMUSCULAR | Status: AC
  Filled 2023-10-20: qty 5

## 2023-10-20 MED ORDER — MIDAZOLAM HCL 2 MG/2ML IJ SOLN
INTRAMUSCULAR | Status: AC
  Filled 2023-10-20: qty 2

## 2023-10-20 MED ORDER — KETOROLAC TROMETHAMINE 30 MG/ML IJ SOLN
30.0000 mg | Freq: Four times a day (QID) | INTRAMUSCULAR | Status: DC
  Administered 2023-10-20 – 2023-10-21 (×3): 30 mg via INTRAVENOUS
  Filled 2023-10-20 (×3): qty 1

## 2023-10-20 MED ORDER — BUPROPION HCL ER (XL) 150 MG PO TB24
150.0000 mg | ORAL_TABLET | Freq: Every day | ORAL | Status: DC
  Administered 2023-10-21: 150 mg via ORAL
  Filled 2023-10-20: qty 1

## 2023-10-20 MED ORDER — OXYCODONE HCL 5 MG/5ML PO SOLN
5.0000 mg | Freq: Once | ORAL | Status: DC | PRN
Start: 2023-10-20 — End: 2023-10-20

## 2023-10-20 MED ORDER — ROCURONIUM BROMIDE 10 MG/ML (PF) SYRINGE
PREFILLED_SYRINGE | INTRAVENOUS | Status: DC | PRN
  Administered 2023-10-20 (×2): 10 mg via INTRAVENOUS
  Administered 2023-10-20: 60 mg via INTRAVENOUS
  Administered 2023-10-20 (×2): 10 mg via INTRAVENOUS

## 2023-10-20 MED ORDER — MIDAZOLAM HCL 2 MG/2ML IJ SOLN
1.0000 mg | Freq: Once | INTRAMUSCULAR | Status: AC
  Administered 2023-10-20: 2 mg via INTRAVENOUS

## 2023-10-20 MED ORDER — ONDANSETRON HCL 4 MG/2ML IJ SOLN: 4.0000 mg | Freq: Four times a day (QID) | INTRAMUSCULAR | Status: DC | PRN

## 2023-10-20 MED ORDER — KETAMINE HCL 10 MG/ML IJ SOLN
INTRAMUSCULAR | Status: DC | PRN
  Administered 2023-10-20: 30 mg via INTRAVENOUS

## 2023-10-20 MED ORDER — FENTANYL CITRATE (PF) 100 MCG/2ML IJ SOLN
50.0000 ug | Freq: Once | INTRAMUSCULAR | Status: AC
  Administered 2023-10-20: 100 ug via INTRAVENOUS

## 2023-10-20 MED ORDER — 0.9 % SODIUM CHLORIDE (POUR BTL) OPTIME
TOPICAL | Status: DC | PRN
  Administered 2023-10-20: 2000 mL

## 2023-10-20 MED ORDER — KETOROLAC TROMETHAMINE 30 MG/ML IJ SOLN
30.0000 mg | Freq: Four times a day (QID) | INTRAMUSCULAR | Status: DC
  Filled 2023-10-20: qty 1

## 2023-10-20 MED ORDER — DEXAMETHASONE SODIUM PHOSPHATE 10 MG/ML IJ SOLN
INTRAMUSCULAR | Status: DC | PRN
  Administered 2023-10-20: 10 mg via INTRAVENOUS

## 2023-10-20 MED ORDER — FENTANYL CITRATE (PF) 100 MCG/2ML IJ SOLN
INTRAMUSCULAR | Status: DC | PRN
  Administered 2023-10-20: 25 ug via INTRAVENOUS
  Administered 2023-10-20 (×2): 50 ug via INTRAVENOUS

## 2023-10-20 MED ORDER — ROCURONIUM BROMIDE 10 MG/ML (PF) SYRINGE
PREFILLED_SYRINGE | INTRAVENOUS | Status: AC
  Filled 2023-10-20: qty 10

## 2023-10-20 MED ORDER — POVIDONE-IODINE 10 % EX SWAB
2.0000 | Freq: Once | CUTANEOUS | Status: AC
  Administered 2023-10-20: 2 via TOPICAL

## 2023-10-20 MED ORDER — SIMETHICONE 80 MG PO CHEW: 80.0000 mg | CHEWABLE_TABLET | Freq: Four times a day (QID) | ORAL | Status: DC | PRN

## 2023-10-20 MED ORDER — MENTHOL 3 MG MT LOZG
1.0000 | LOZENGE | OROMUCOSAL | Status: DC | PRN
Start: 2023-10-20 — End: 2023-10-21

## 2023-10-20 MED ORDER — BUPIVACAINE-EPINEPHRINE (PF) 0.25% -1:200000 IJ SOLN
INTRAMUSCULAR | Status: DC | PRN
  Administered 2023-10-20 (×2): 25 mL

## 2023-10-20 MED ORDER — HYDROMORPHONE HCL 1 MG/ML IJ SOLN
0.5000 mg | INTRAMUSCULAR | Status: DC | PRN
  Administered 2023-10-20: 0.5 mg via INTRAVENOUS
  Filled 2023-10-20: qty 0.5

## 2023-10-20 MED ORDER — OXYCODONE HCL 5 MG PO TABS
5.0000 mg | ORAL_TABLET | ORAL | Status: DC | PRN
  Administered 2023-10-20: 10 mg via ORAL
  Filled 2023-10-20: qty 1
  Filled 2023-10-20: qty 2

## 2023-10-20 MED ORDER — ALBUMIN HUMAN 5 % IV SOLN: INTRAVENOUS | Status: DC | PRN

## 2023-10-20 SURGICAL SUPPLY — 36 items
APL SKNCLS STERI-STRIP NONHPOA (GAUZE/BANDAGES/DRESSINGS) ×1
BENZOIN TINCTURE PRP APPL 2/3 (GAUZE/BANDAGES/DRESSINGS) IMPLANT
DRAPE CESAREAN BIRTH W POUCH (DRAPES) ×1 IMPLANT
DRAPE WARM FLUID 44X44 (DRAPES) IMPLANT
DRSG OPSITE POSTOP 4X10 (GAUZE/BANDAGES/DRESSINGS) ×1 IMPLANT
DRSG TEGADERM 2-3/8X2-3/4 SM (GAUZE/BANDAGES/DRESSINGS) IMPLANT
DURAPREP 26ML APPLICATOR (WOUND CARE) ×1 IMPLANT
GAUZE 4X4 16PLY ~~LOC~~+RFID DBL (SPONGE) IMPLANT
GLOVE BIO SURGEON STRL SZ 6 (GLOVE) IMPLANT
GLOVE BIO SURGEON STRL SZ7 (GLOVE) ×1 IMPLANT
GLOVE BIOGEL PI IND STRL 6 (GLOVE) IMPLANT
GLOVE BIOGEL PI IND STRL 7.0 (GLOVE) ×3 IMPLANT
GOWN STRL REUS W/TWL LRG LVL3 (GOWN DISPOSABLE) ×3 IMPLANT
HOLDER FOLEY CATH W/STRAP (MISCELLANEOUS) IMPLANT
KIT TURNOVER CYSTO (KITS) ×1 IMPLANT
LIGASURE IMPACT 36 18CM CVD LR (INSTRUMENTS) IMPLANT
NDL HYPO 22X1.5 SAFETY MO (MISCELLANEOUS) ×1 IMPLANT
NEEDLE HYPO 22X1.5 SAFETY MO (MISCELLANEOUS) ×1 IMPLANT
NS IRRIG 1000ML POUR BTL (IV SOLUTION) ×1 IMPLANT
PACK ABDOMINAL GYN (CUSTOM PROCEDURE TRAY) ×1 IMPLANT
PAD OB MATERNITY 4.3X12.25 (PERSONAL CARE ITEMS) ×1 IMPLANT
PENCIL SMOKE EVACUATOR (MISCELLANEOUS) ×1 IMPLANT
SCRUB CHG 4% DYNA-HEX 4OZ (MISCELLANEOUS) ×1 IMPLANT
SLEEVE SCD COMPRESS KNEE MED (STOCKING) ×1 IMPLANT
SPONGE T-LAP 18X18 ~~LOC~~+RFID (SPONGE) ×2 IMPLANT
STRIP CLOSURE SKIN 1/2X4 (GAUZE/BANDAGES/DRESSINGS) IMPLANT
SUT PLAIN 2 0 XLH (SUTURE) IMPLANT
SUT VIC AB 0 CT1 18XCR BRD8 (SUTURE) ×3 IMPLANT
SUT VIC AB 0 CT1 36 (SUTURE) ×4 IMPLANT
SUT VIC AB 0 CT1 8-18 (SUTURE) ×2
SUT VIC AB 4-0 PS2 18 (SUTURE) ×1 IMPLANT
SUT VICRYL 0 TIES 12 18 (SUTURE) ×1 IMPLANT
SYR CONTROL 10ML LL (SYRINGE) ×1 IMPLANT
TOWEL OR 17X24 6PK STRL BLUE (TOWEL DISPOSABLE) ×2 IMPLANT
TRAY FOLEY W/BAG SLVR 14FR LF (SET/KITS/TRAYS/PACK) ×1 IMPLANT
WATER STERILE IRR 500ML POUR (IV SOLUTION) IMPLANT
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
## 2023-10-20 NOTE — Transfer of Care (Signed)
Immediate Anesthesia Transfer of Care Note  Patient: Branden Ferrar  Procedure(s) Performed: HYSTERECTOMY ABDOMINAL WITH SALPINGECTOMY (Abdomen)  Patient Location: PACU  Anesthesia Type:General  Level of Consciousness: drowsy, patient cooperative, and responds to stimulation  Airway & Oxygen Therapy: Patient Spontanous Breathing and Patient connected to nasal cannula oxygen  Post-op Assessment: Report given to RN and Post -op Vital signs reviewed and stable  Post vital signs: Reviewed and stable  Last Vitals:  Vitals Value Taken Time  BP 132/76 10/20/23 1645  Temp 36.8 C 10/20/23 1642  Pulse 88 10/20/23 1646  Resp 14 10/20/23 1646  SpO2 100 % 10/20/23 1646  Vitals shown include unfiled device data.  Last Pain:  Vitals:   10/20/23 1310  TempSrc:   PainSc: Asleep      Patients Stated Pain Goal: 5 (10/20/23 1152)  Complications: No notable events documented.

## 2023-10-20 NOTE — Progress Notes (Signed)
Assisted Dr. Casilda Carls with bilateral transabdominal plane, ultrasound guided block. Side rails up, monitors on throughout procedure. See vital signs in flow sheet. Tolerated Procedure well.

## 2023-10-20 NOTE — Anesthesia Preprocedure Evaluation (Signed)
Anesthesia Evaluation  Patient identified by MRN, date of birth, ID band Patient awake    Reviewed: Allergy & Precautions, NPO status , Patient's Chart, lab work & pertinent test results  Airway Mallampati: II  TM Distance: >3 FB Neck ROM: Full    Dental  (+) Dental Advisory Given   Pulmonary neg pulmonary ROS   breath sounds clear to auscultation       Cardiovascular negative cardio ROS  Rhythm:Regular Rate:Normal     Neuro/Psych  Headaches    GI/Hepatic Neg liver ROS,GERD  ,,  Endo/Other  negative endocrine ROS    Renal/GU negative Renal ROS     Musculoskeletal   Abdominal   Peds  Hematology negative hematology ROS (+)   Anesthesia Other Findings   Reproductive/Obstetrics                             Anesthesia Physical Anesthesia Plan  ASA: 2  Anesthesia Plan: General   Post-op Pain Management: Regional block*, Tylenol PO (pre-op)*, Toradol IV (intra-op)* and Ketamine IV*   Induction: Intravenous  PONV Risk Score and Plan: 4 or greater and Dexamethasone, Ondansetron, Midazolam, Scopolamine patch - Pre-op and Treatment may vary due to age or medical condition  Airway Management Planned: Oral ETT  Additional Equipment:   Intra-op Plan:   Post-operative Plan: Extubation in OR  Informed Consent: I have reviewed the patients History and Physical, chart, labs and discussed the procedure including the risks, benefits and alternatives for the proposed anesthesia with the patient or authorized representative who has indicated his/her understanding and acceptance.     Dental advisory given  Plan Discussed with: CRNA  Anesthesia Plan Comments:        Anesthesia Quick Evaluation

## 2023-10-20 NOTE — Op Note (Addendum)
PRE-OPERATIVE DIAGNOSIS:  Uterine Fibroid with bulk symptoms   POST-OPERATIVE DIAGNOSIS:  Normal size uterus with a large right sides cervical mass arising from upper cervix filling up and lifting right retroperitoneal area   PROCEDURE:  TOTAL ABDOMINAL HYSTERECTOMY, BILATERAL SALPINGECTOMY and resection of right retroperitoneal multicystic mass from superior lateral aspect of cervix.   SURGEON: Robley Fries, MD Consultant/ Co- Surgeon Eugene Garnet MD  ASSISTANT:  Marlene Bast, MD   ANESTHESIA:  General endotracheal  EBL: 200 cc  IVF: 600 cc LR and 250 cc Albumin   Urine output: urine in foley clear, 200 cc   BLOOD ADMINISTERED: None   DRAINS: Urinary Catheter (Foley)   LOCAL MEDICATIONS USED:  MARCAINE 0.25% 10 cc skin infiltration     SPECIMEN:  Uterus with cervix with right retroperitoneal mass- degenerated myoma and bilateral fallopian tubes   COUNTS:  YES  PATIENT DISPOSITION:  PACU - hemodynamically stable.    Delay start of Pharmacological VTE agent (>24hrs) due to surgical blood loss or risk of bleeding: yes  PROCEDURE:   Indication:  Symptomatic uterine fibroids with abdominal mass, pressure effect. Pelvic sono and MRI suggested right lateral lower segment or cervical myoma. Patient desired hysterectomy. Salpingectomy was advised and agreed. Risks and complications of surgery including infection, bleeding, damage to internal organs and other including but not limited to surgery related problems including pneumonia, VTE reviewed. Informed written consent was obtained.   Patient was brought to the operating room with IV running. She received 2 gm Ancef. She underwent general anesthesia without difficulty and was given dorsal supine position, prepped and draped in sterile fashion. Foley catheter was placed. . Pfannenstiel incision was made with scalpel and carried down to the underlying fascia with Bovie with excellent hemostasis.  Fascia incised and extended  laterally. Fascia grasped with Kocher's and underlying rectus muscles were dissected down. Rectus muscles were adherent in midline. Scar tissue in midline lifted with Allis clamps and incised with scalpel to create peritoneal window. On palpation, uterine anterior surface was adherent to anterior peritoneum and rectus muscles. Careful sharp dissection performed and some monopolar cautery in midline to release uterus from anterior wall. Lateral dissection performed by creating windows to work with and uterus was freed from side wall adhesions. Uterus was normal size with normal tubes and ovaries though tubes and ovaries had adhesions.  Bladder was adherent to lower segment. Large right sided cystic bulge was palpated from the level of right side of lower segment/ upper cervix  filling up and lifting right and posterior and anterior retroperitoneal space. I could not assess right ureter. I was anticipating a firm fibroid and this mass was soft/ cystic and appeared vascular so further dissection was not continued and intra-op consult with Gynec-Oncologist Dr Pricilla Holm was requested. Anterior uterine wall that was dissected was bleeding, that was closed with 0-Vicryl to control bleeding.  Dr Pricilla Holm arrived soon. Assessed the mass and expected it to be a degenerated retroperitoneal fibroid and since patient was counseled for hysterectomy, procedure continued.  Bilateral round ligaments were desiccated and cut monopolar cautery. Anterior bladder dissection performed to push it down. Tubo-ovarian adhesions were released, Bilateral salpingectomy performed with Ligasure. Broad ligament dissected, window created and right utero-ovarian ligament desiccated and cut followed by left utero-ovarian ligament. Right broad ligament was opened anteriorly and posteriorly to reach the mass. Mass was fluctuant and soft and cystic in areas. Mass was bluntly dissected off from broad ligament and retroperitoneal area. It seemed to be arising  from right lower uterus/ upper cervical area. Uterine arteries assessed and skeletonsized. Uterine vessels clamped and desiccated and cut with Ligasure. Then cardinal and utero sacral ligaments were clamped,  cut and transfixed. Sponge stick in vagina used as guide to assess cervico-vaginal  junction. Bilateral angles of vagina were grasped with uterosacral and cut bilaterally and colpotomy was noted. Colpotomy completed. Uterus and cervix and tubes and mass were handed off to RN. The mass remained intact throughout the procedure. Vaginal angles suture transfixed. Vaginal edges were hemostatic and closed with 2-0 Vicryl.  Ureter was noted In the medial side of right retroperitoneal dissection and with good peristalsis. Bleeding was very well controlled. All pedicles appeared dry. Ovaries looked hemostatic after controlling right pedicle bleeding with Ligasure.  Suction irrigation done. Peritoneal edges grasped but were too tensed and limted to close in midline. Fascia sutured with 0 Vicryl from two ends and met in midline. Subcutaneous layer closed with 2-0 Plain gut. Skin approximated with 4-0 Vicryl in subcuticular fashion.  Sterile dressing placed.  Sterile Honeycomb dressing placed.  All  Instruments/ lap/ sponges counts were correct x2. No complications.  Dr Juliene Pina and Dr Pricilla Holm were co-surgeons for this case.   An experienced co-surgeon consultant was required given the complexity of surgical case.

## 2023-10-20 NOTE — Op Note (Signed)
OPERATIVE NOTE  Pre-operative Diagnosis: Lower uterine segment versus cervical mass  Post-operative Diagnosis: same  Operation: Total abdominal hysterectomy with bilateral salpingectomy  Surgeon: Eugene Garnet MD  Anesthesia: GET  Urine Output: see Dr. Camillia Herter op note  Operative Findings: Upon my arrival, Pfannenstiel incision had been made and the anterior uterus had been dissected free from the lower abdominal wall.  The uterus itself was approximately 8 cm and normal in size, somewhat deviated anteriorly and to the left by a large, approximately 12 cm multicystic mass arising from what appeared to be the right aspect of the lower uterine segment or right aspect of the superior cervix.  The mass filled much of the retroperitoneum on the right, deviating the entire uterus and cervix.  This was most consistent with a cystic lesion, perhaps a degenerated fibroid.  The entire mass was excised without any spillage of cystic contents.  Normal-appearing fallopian tubes and ovaries.  Estimated Blood Loss:  see Dr. Camillia Herter op note      Total IV Fluids: see I&O flowsheet         Specimens: uterus, cervix, bilateral tubes         Complications:  None apparent; patient tolerated the procedure well.         Disposition: PACU - hemodynamically stable.  Procedure Details  The patient was taken to the OR by Dr. Juliene Pina.  See her operative report for details about timeout, patient positioning, prepping, and abdominal entry.  At the time that I presented, the abdomen was surveyed with findings noted above.  Large, cystic mass was noted to be bulging within the retroperitoneum on the right.    Given its location and the patient's desire for definitive treatment, decision was made to proceed with total abdominal hysterectomy bilateral salpingectomy.  Bilateral round ligaments were transected using monopolar electrocautery.  The fallopian tubes were elevated from the underlying ovary and bipolar  electrocautery using the LigaSure device was used to cauterize and transect them along the mesosalpinx, freeing them from the ovary.  The utero-ovarian ligament was isolated, cauterized, and transected.  This was performed bilaterally.  The retroperitoneal incision on the right was made parallel to the IP ligament to open up the retroperitoneal space.  Blunt dissection was then used to gently free the cystic mass from the right pelvic sidewall.  Attention was then turned anteriorly.  The bladder flap was created with monopolar electrocautery.  A sponge stick was placed in the vagina to help delineate the cervicovaginal junction.  Posteriorly, monopolar electrocautery was used to develop the rectovaginal septum to below the level of the cervix.  On the right, the ureter was visualized along the medial leaf of the broad ligament, well above where we were mobilizing the cystic mass.  Once the mass was fully mobilized from the sidewall, the uterine vessels were skeletonized bilaterally. The uterine vessels were clamped bilaterally at the uterine isthmus with curved Heaney clamps then the pedicles were transected and suture ligated. Successive passes of straight Heaney clamps were used down the cardinal ligaments bilaterally with each pass medial to the prior. The pedicles were sharply transected and made hemostatic with suture. The bladder was ensured to be below the cervicovaginal junction, then curved clamps were passed across the cervicovaginal junction and the uterus was sharply transected.   The case was then turned back over to Dr. Juliene Pina.   At this point, I went to the waiting room and updated the patient's partner in person.  I discussed findings and  the procedure that had been performed.  All questions were answered.  Eugene Garnet, MD

## 2023-10-20 NOTE — Anesthesia Procedure Notes (Signed)
Procedure Name: Intubation Date/Time: 10/20/2023 1:42 PM  Performed by: Bishop Limbo, CRNAPre-anesthesia Checklist: Patient identified, Emergency Drugs available, Suction available and Patient being monitored Patient Re-evaluated:Patient Re-evaluated prior to induction Oxygen Delivery Method: Circle System Utilized Preoxygenation: Pre-oxygenation with 100% oxygen Induction Type: IV induction Ventilation: Mask ventilation without difficulty Laryngoscope Size: Mac and 3 Grade View: Grade I Tube type: Oral Tube size: 7.0 mm Number of attempts: 1 Airway Equipment and Method: Stylet and Bite block Placement Confirmation: ETT inserted through vocal cords under direct vision, positive ETCO2 and breath sounds checked- equal and bilateral Secured at: 22 cm Tube secured with: Tape Dental Injury: Teeth and Oropharynx as per pre-operative assessment

## 2023-10-20 NOTE — Plan of Care (Signed)

## 2023-10-20 NOTE — Anesthesia Procedure Notes (Signed)
Anesthesia Regional Block: TAP block   Pre-Anesthetic Checklist: , timeout performed,  Correct Patient, Correct Site, Correct Laterality,  Correct Procedure, Correct Position, site marked,  Risks and benefits discussed,  Surgical consent,  Pre-op evaluation,  At surgeon's request and post-op pain management  Laterality: Right and Left  Prep: chloraprep       Needles:  Injection technique: Single-shot  Needle Type: Echogenic Needle     Needle Length: 9cm  Needle Gauge: 21     Additional Needles:   Procedures:,,,, ultrasound used (permanent image in chart),,    Narrative:  Start time: 10/20/2023 12:47 PM End time: 10/20/2023 12:58 PM Injection made incrementally with aspirations every 5 mL.  Performed by: Personally  Anesthesiologist: Marcene Duos, MD

## 2023-10-20 NOTE — Anesthesia Postprocedure Evaluation (Signed)
Anesthesia Post Note  Patient: Donna Dorsey  Procedure(s) Performed: HYSTERECTOMY ABDOMINAL WITH SALPINGECTOMY (Abdomen)     Patient location during evaluation: PACU Anesthesia Type: General Level of consciousness: awake and alert Pain management: pain level controlled Vital Signs Assessment: post-procedure vital signs reviewed and stable Respiratory status: spontaneous breathing, nonlabored ventilation and respiratory function stable Cardiovascular status: blood pressure returned to baseline Postop Assessment: no apparent nausea or vomiting Anesthetic complications: no   No notable events documented.  Last Vitals:  Vitals:   10/20/23 1730 10/20/23 1745  BP: 133/81 134/82  Pulse: 87 91  Resp: (!) 8 17  Temp:    SpO2: 98% 98%    Last Pain:  Vitals:   10/20/23 1700  TempSrc:   PainSc: 0-No pain                 Shanda Howells

## 2023-10-21 LAB — CBC
HCT: 31.5 % — ABNORMAL LOW (ref 36.0–46.0)
Hemoglobin: 10.2 g/dL — ABNORMAL LOW (ref 12.0–15.0)
MCH: 29.3 pg (ref 26.0–34.0)
MCHC: 32.4 g/dL (ref 30.0–36.0)
MCV: 90.5 fL (ref 80.0–100.0)
Platelets: 223 10*3/uL (ref 150–400)
RBC: 3.48 MIL/uL — ABNORMAL LOW (ref 3.87–5.11)
RDW: 13.5 % (ref 11.5–15.5)
WBC: 11 10*3/uL — ABNORMAL HIGH (ref 4.0–10.5)
nRBC: 0 % (ref 0.0–0.2)

## 2023-10-21 MED ORDER — ACETAMINOPHEN 325 MG PO TABS: 650.0000 mg | ORAL_TABLET | ORAL | Status: AC | PRN

## 2023-10-21 MED ORDER — IBUPROFEN 600 MG PO TABS: 600.0000 mg | ORAL_TABLET | Freq: Four times a day (QID) | ORAL | 0 refills | Status: AC

## 2023-10-21 MED ORDER — OXYCODONE HCL 5 MG PO TABS: 5.0000 mg | ORAL_TABLET | Freq: Four times a day (QID) | ORAL | 0 refills | Status: AC | PRN

## 2023-10-21 NOTE — Treatment Plan (Signed)
Brief Update  I introduced myself to the patient this morning.  I discussed my involvement in her surgery yesterday and findings during the surgery.  I discussed the decision about proceeding with total abdominal hysterectomy and resection of what we suspect is a lower uterine segment mass, possibly degenerated fibroid.  Discussed that there were no findings at the time of surgery that were suspicious for malignancy.  Final pathology should be back next week.  I defer to primary team for routine postoperative care.  Eugene Garnet MD Gynecologic Oncology

## 2023-10-21 NOTE — Progress Notes (Signed)
   10/21/23 1126  TOC Brief Assessment  Insurance and Status Reviewed  Patient has primary care physician Yes  Home environment has been reviewed Single family home w/ spouse  Prior level of function: Independent  Prior/Current Home Services No current home services  Social Determinants of Health Reivew SDOH reviewed no interventions necessary  Readmission risk has been reviewed Yes  Transition of care needs no transition of care needs at this time

## 2023-10-21 NOTE — Progress Notes (Signed)
Pt d/c'd per MD order. Pts IV was removed and pressure was held. Pt was given AVS documentation and questions were answered. Pt was stable leaving the floor. Pt left floor via WC to private vehicle with husband

## 2023-10-21 NOTE — Progress Notes (Signed)
1 Day Post-Op Procedure(s) (LRB): HYSTERECTOMY ABDOMINAL WITH SALPINGECTOMY (N/A) Excision of right broad ligament mass/ likely degenerated myoma  GYnOnc Dr Pricilla Holm Co-surgeon   Subjective: Patient reports pain is well controlled.  She has not ambulated or voided since foley was just removed. She didn't not try general diet, tried a few crackers but didn't have appetite. No N/v.   Objective: I have reviewed patient's vital signs, intake and output, medications, and labs.  General: alert, cooperative, and appears stated age Resp: clear to auscultation bilaterally Cardio: regular rate and rhythm, S1, S2 normal, no murmur, click, rub or gallop GI: soft, non-tender; bowel sounds normal; no masses,  no organomegaly and incision: dressing dry Extremities: Homans sign is negative, no sign of DVT Vaginal Bleeding: none     Latest Ref Rng & Units 10/21/2023    6:05 AM 10/17/2023    1:59 PM  CBC  WBC 4.0 - 10.5 K/uL 11.0  5.5   Hemoglobin 12.0 - 15.0 g/dL 09.8  11.9   Hematocrit 36.0 - 46.0 % 31.5  38.8   Platelets 150 - 400 K/uL 223  275      Assessment/ plan: s/p Procedure(s): HYSTERECTOMY ABDOMINAL WITH SALPINGECTOMY (N/A): stable, progressing well, and plan to ambulate, PO pain meds, d/c IV and reassess if tolerates regular diet. Plan to dc home later today or tomorrow AM Will return to reassess    Robley Fries, MD 10/21/2023

## 2023-10-23 NOTE — Discharge Summary (Signed)
Physician Discharge Summary  Patient ID: Donna Dorsey MRN: [id number] DOB/AGE: 41-Oct-1982 41 y.o.  Admit date: 10/20/2023 Discharge date: 10/20/2013  Admission Diagnoses: Pelvic pressure, uterine myoma  Discharge Diagnoses:  S/P TAH (total abdominal hysterectomy), bilateral salpingectomy. Excision of large right broad ligament mass    Discharged Condition: good  Hospital Course: uncomplicated post-op recovery  Consults: GynOncologist Dr Pricilla Holm  Discharge Exam: Blood pressure 109/65, pulse 74, temperature 98.6 F (37 C), temperature source Oral, resp. rate 15, height 5\' 4"  (1.626 m), weight 78.9 kg, last menstrual period 09/22/2023, SpO2 100%. General appearance: alert and cooperative Resp: clear to auscultation bilaterally Cardio: regular rate and rhythm, S1, S2 normal, no murmur, click, rub or gallop GI: soft, non-tender; bowel sounds normal; no masses,  no organomegaly and active bowel sounds  Extremities: Homans sign is negative, no sign of DVT Incision/Wound: clean and intact under honecomb dressing   Disposition: Discharge disposition: 01-Home or Self Care       Discharge Instructions     Call MD for:   Complete by: As directed    Vagina bleeding other than slight spots   Call MD for:  difficulty breathing, headache or visual disturbances   Complete by: As directed    Call MD for:  hives   Complete by: As directed    Call MD for:  persistant dizziness or light-headedness   Complete by: As directed    Call MD for:  persistant nausea and vomiting   Complete by: As directed    Call MD for:  redness, tenderness, or signs of infection (pain, swelling, redness, odor or green/yellow discharge around incision site)   Complete by: As directed    Call MD for:  severe uncontrolled pain   Complete by: As directed    Call MD for:  temperature >100.4   Complete by: As directed    Diet - low sodium heart healthy   Complete by: As directed    Discharge wound care:    Complete by: As directed    Ok to shower with Honeycomb dressing. Remove it on Tues 10/29   Driving Restrictions   Complete by: As directed    2 weeks   Increase activity slowly   Complete by: As directed    Lifting restrictions   Complete by: As directed    10 pounds 6 weeks   Sexual Activity Restrictions   Complete by: As directed    6weeks      Allergies as of 10/21/2023   No Known Allergies      Medication List     TAKE these medications    acetaminophen 325 MG tablet Commonly known as: TYLENOL Take 2 tablets (650 mg total) by mouth every 4 (four) hours as needed for mild pain (pain score 1-3) (temperature > 101.5.).   buPROPion 150 MG 24 hr tablet Commonly known as: WELLBUTRIN XL Take 150 mg by mouth daily.   HAIR SKIN AND NAILS FORMULA PO Take by mouth daily.   ibuprofen 600 MG tablet Commonly known as: ADVIL Take 1 tablet (600 mg total) by mouth every 6 (six) hours.   oxyCODONE 5 MG immediate release tablet Commonly known as: Oxy IR/ROXICODONE Take 1 tablet (5 mg total) by mouth every 6 (six) hours as needed for up to 5 days for moderate pain (pain score 4-6) or severe pain (pain score 7-10).   Vitamin D (Ergocalciferol) 1.25 MG (50000 UNIT) Caps capsule Commonly known as: DRISDOL Take 50,000 Units by mouth every 7 (seven)  days.               Discharge Care Instructions  (From admission, onward)           Start     Ordered   10/21/23 0000  Discharge wound care:       Comments: Ok to shower with Honeycomb dressing. Remove it on Tues 10/29   10/21/23 1613             Signed: Alfredia Ferguson Johannah Rozas 10/23/2023, 9:24 PM

## 2023-10-24 ENCOUNTER — Encounter (HOSPITAL_BASED_OUTPATIENT_CLINIC_OR_DEPARTMENT_OTHER): Payer: Self-pay | Admitting: Obstetrics & Gynecology

## 2023-10-24 LAB — SURGICAL PATHOLOGY
